# Patient Record
Sex: Female | Born: 1970 | Race: White | Hispanic: No | Smoking: Current every day smoker
Health system: Southern US, Community
[De-identification: ages and names within clinical notes are randomized; demographics above are authoritative.]

## PROBLEM LIST (undated history)

## (undated) DIAGNOSIS — Z789 Other specified health status: Secondary | ICD-10-CM

## (undated) DIAGNOSIS — M549 Dorsalgia, unspecified: Secondary | ICD-10-CM

## (undated) HISTORY — PX: NECK SURGERY: SHX720

## (undated) HISTORY — PX: AUGMENTATION MAMMAPLASTY: SUR837

---

## 1998-03-16 ENCOUNTER — Other Ambulatory Visit: Admission: RE | Admit: 1998-03-16 | Discharge: 1998-03-16 | Payer: Self-pay | Admitting: Obstetrics & Gynecology

## 1998-05-20 ENCOUNTER — Emergency Department (HOSPITAL_COMMUNITY): Admission: EM | Admit: 1998-05-20 | Discharge: 1998-05-20 | Payer: Self-pay | Admitting: Emergency Medicine

## 1998-06-21 ENCOUNTER — Other Ambulatory Visit: Admission: RE | Admit: 1998-06-21 | Discharge: 1998-06-21 | Payer: Self-pay | Admitting: Obstetrics & Gynecology

## 1999-03-08 ENCOUNTER — Emergency Department (HOSPITAL_COMMUNITY): Admission: EM | Admit: 1999-03-08 | Discharge: 1999-03-09 | Payer: Self-pay

## 2000-09-08 ENCOUNTER — Emergency Department (HOSPITAL_COMMUNITY): Admission: EM | Admit: 2000-09-08 | Discharge: 2000-09-08 | Payer: Self-pay | Admitting: Emergency Medicine

## 2000-09-08 ENCOUNTER — Encounter: Payer: Self-pay | Admitting: Internal Medicine

## 2001-02-25 ENCOUNTER — Other Ambulatory Visit: Admission: RE | Admit: 2001-02-25 | Discharge: 2001-02-25 | Payer: Self-pay | Admitting: Obstetrics & Gynecology

## 2002-03-06 ENCOUNTER — Other Ambulatory Visit: Admission: RE | Admit: 2002-03-06 | Discharge: 2002-03-06 | Payer: Self-pay | Admitting: Obstetrics & Gynecology

## 2003-04-22 ENCOUNTER — Emergency Department (HOSPITAL_COMMUNITY): Admission: EM | Admit: 2003-04-22 | Discharge: 2003-04-22 | Payer: Self-pay | Admitting: Emergency Medicine

## 2003-04-24 ENCOUNTER — Emergency Department (HOSPITAL_COMMUNITY): Admission: EM | Admit: 2003-04-24 | Discharge: 2003-04-24 | Payer: Self-pay | Admitting: Emergency Medicine

## 2003-04-26 ENCOUNTER — Ambulatory Visit (HOSPITAL_COMMUNITY): Admission: RE | Admit: 2003-04-26 | Discharge: 2003-04-26 | Payer: Self-pay | Admitting: *Deleted

## 2003-08-16 ENCOUNTER — Other Ambulatory Visit: Admission: RE | Admit: 2003-08-16 | Discharge: 2003-08-16 | Payer: Self-pay | Admitting: Obstetrics & Gynecology

## 2006-03-23 ENCOUNTER — Emergency Department (HOSPITAL_COMMUNITY): Admission: EM | Admit: 2006-03-23 | Discharge: 2006-03-23 | Payer: Self-pay | Admitting: Emergency Medicine

## 2006-03-24 ENCOUNTER — Ambulatory Visit (HOSPITAL_COMMUNITY): Admission: RE | Admit: 2006-03-24 | Discharge: 2006-03-24 | Payer: Self-pay | Admitting: Emergency Medicine

## 2006-04-19 ENCOUNTER — Inpatient Hospital Stay (HOSPITAL_COMMUNITY): Admission: RE | Admit: 2006-04-19 | Discharge: 2006-04-21 | Payer: Self-pay | Admitting: Neurosurgery

## 2006-04-19 ENCOUNTER — Encounter (INDEPENDENT_AMBULATORY_CARE_PROVIDER_SITE_OTHER): Payer: Self-pay | Admitting: Specialist

## 2006-05-05 ENCOUNTER — Emergency Department (HOSPITAL_COMMUNITY): Admission: EM | Admit: 2006-05-05 | Discharge: 2006-05-05 | Payer: Self-pay | Admitting: Emergency Medicine

## 2006-05-07 ENCOUNTER — Ambulatory Visit (HOSPITAL_COMMUNITY): Admission: RE | Admit: 2006-05-07 | Discharge: 2006-05-07 | Payer: Self-pay | Admitting: Neurosurgery

## 2007-06-20 IMAGING — CT CT CERVICAL SPINE W/O CM
3 of 7 series · 10 of 33 positions shown, 12 images · IV contrast (agent unspecified)
Comparison: 04/22/2003
COMPARISON: MRI of the cervical spine 03/24/2006 and intraoperative imaging of the cervical spine 04/19/2006.

CLINICAL DATA: Syncope resulting in fall.  Neck pain and history of recent anterior cervical fusion spanning from C3 to C6 on 04/19/2006.
 HEAD CT WITHOUT CONTRAST:
TECHNIQUE: Contiguous axial images were obtained from the base of the skull through the vertex according to standard protocol without contrast.
TECHNIQUE: Multidetector CT imaging of the cervical spine was performed.  Multiplanar CT image reconstructions were also generated.

[Series 600: cor · coronal · 0.35mm/px · 3 of 37 slices shown]
[im 8/37  bone]
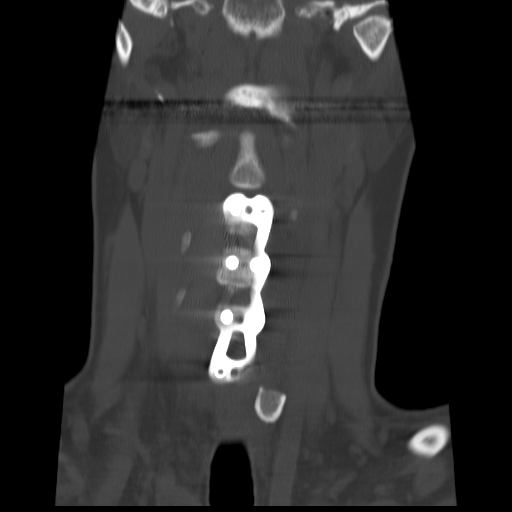
[im 15/37  bone]
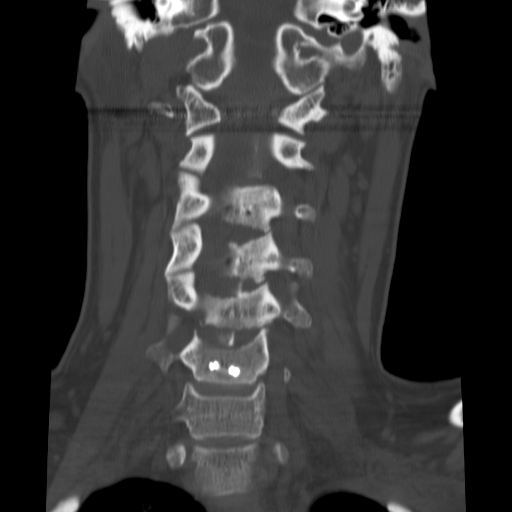
[im 22/37  bone]
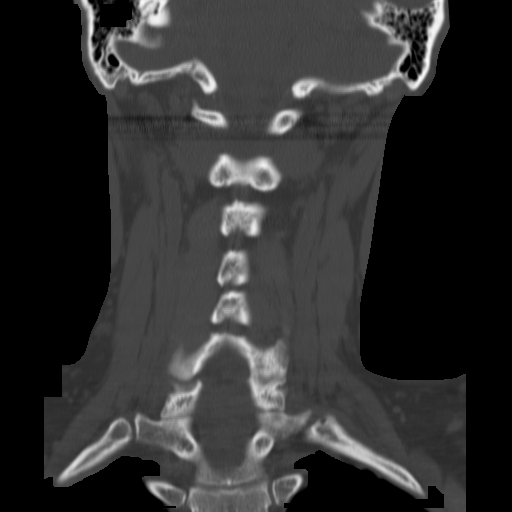

[Series 601: axials · axial · 0.35mm/px · z∈[-53,-1]mm · 2 of 83 slices shown, 3 images]
[im 28/83  soft-tissue]
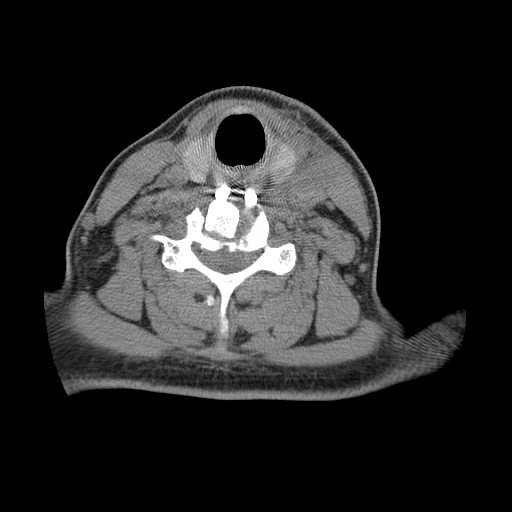
[im 28/83  bone]
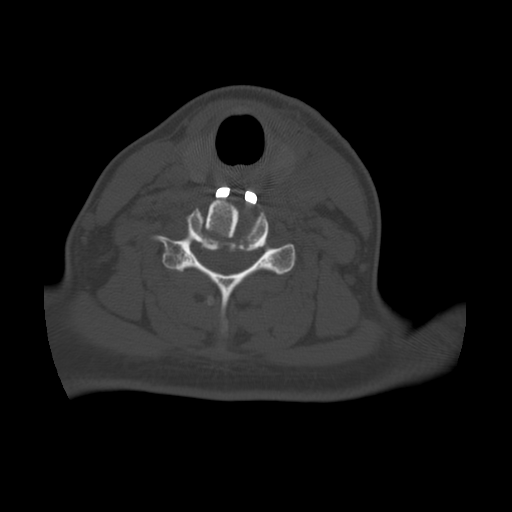
[im 55/83  bone]
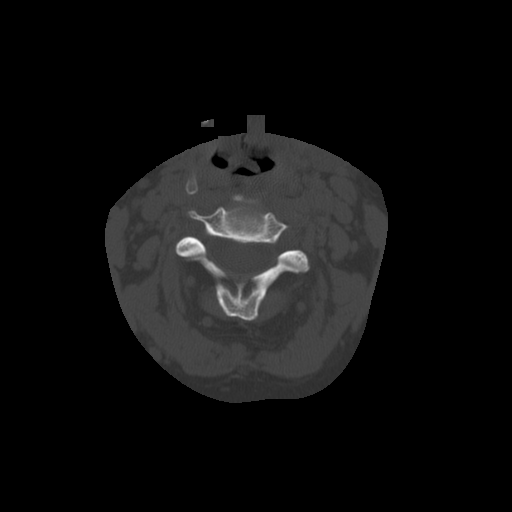

[Series 602: sag · sagittal · 0.35mm/px · 5 of 33 slices shown, 6 images]
[im 11/33  bone]
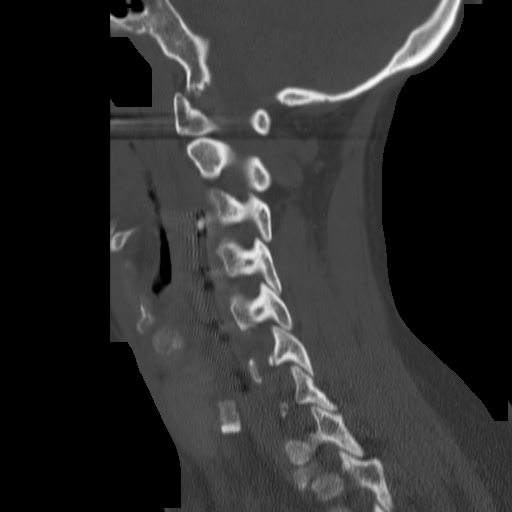
[im 14/33  bone]
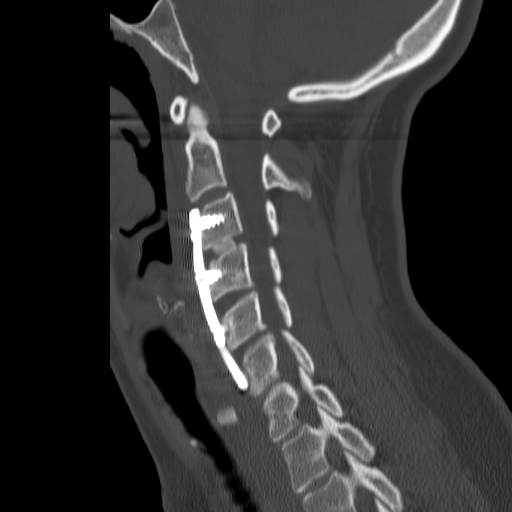
[im 17/33  soft-tissue]
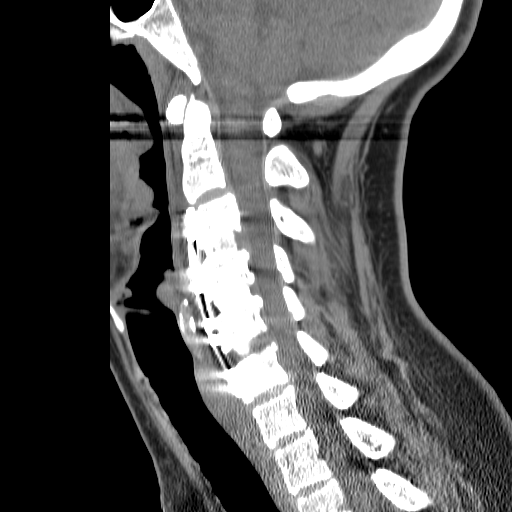
[im 17/33  bone]
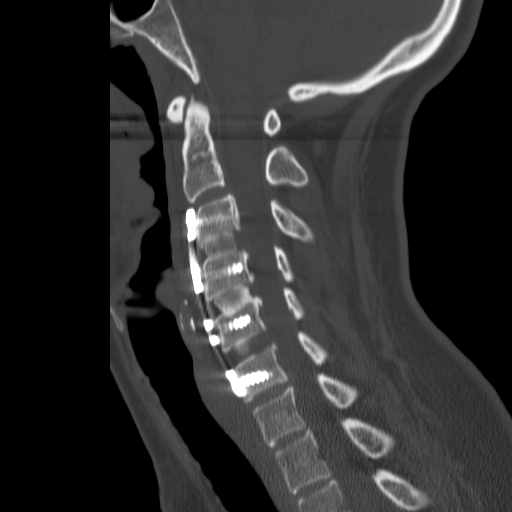
[im 19/33  bone]
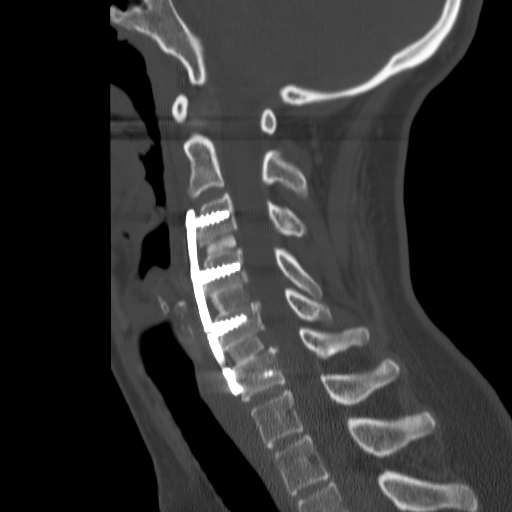
[im 22/33  bone]
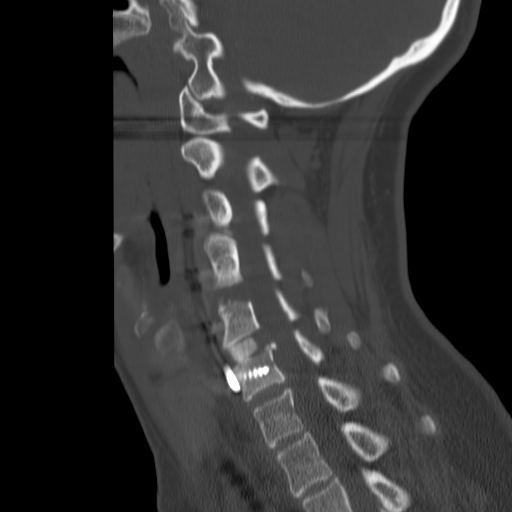

[10 of 33 positions shown; findings below may reference images not displayed]

FINDINGS: There is no evidence of intracranial hemorrhage, brain edema, acute infarct, mass lesion, or mass effect.  No other intra-axial abnormalities 
 are seen, and the ventricles are within normal limits.  No abnormal 
 extra-axial fluid collections or masses are identified.  No skull 
 abnormalities are noted.
IMPRESSION: Negative non-contrast head CT.
 CERVICAL SPINE CT WITHOUT CONTRAST:
FINDINGS: Cervical CT demonstrates anatomic alignment status post cervical fusion.  Spanning from the C3 to C6 levels.  No acute fracture or abnormal positioning of fusion hardware is noted.  Bone allograft material is present at the disk spaces at C3-4, C4-5 and C5-6.   The disk space bone material appears well positioned.  There is an additional piece of corticated bone present in the anterior soft tissue to the left of midline lying left of the esophagus and immediately posterior to the midportion of the thyroid gland.  This piece of bone measures 6 x 10 mm.  The bone fragment lies roughly opposite the C6-7 disk space level relative to the spine.   
 The piece of bone in the soft tissues is not associated with abnormal fluid collection or mass effect on adjacent structures.  No bony extrusion or hardware extrusion is seen into the spinal canal itself.  The airway is normally patent.
IMPRESSION: Normal alignment of fused vertebral bodies status post recent anterior cervical fusion spanning from C3 to C6.  There is a single piece of bone graft material present in the anterior paraspinous tissues at the level of the thyroid gland.  This lies just anterior to the C6-7 level and to the left of midline with the focal corticated fragment measuring 6 x 10 mm.  No surrounding inflammation or fluid collection.

## 2008-03-18 ENCOUNTER — Inpatient Hospital Stay (HOSPITAL_COMMUNITY): Admission: AD | Admit: 2008-03-18 | Discharge: 2008-03-18 | Payer: Self-pay | Admitting: Obstetrics and Gynecology

## 2009-06-24 ENCOUNTER — Encounter: Admission: RE | Admit: 2009-06-24 | Discharge: 2009-06-24 | Payer: Self-pay | Admitting: Obstetrics & Gynecology

## 2010-04-27 LAB — CBC
Hemoglobin: 13.6 g/dL (ref 12.0–15.0)
MCHC: 33.6 g/dL (ref 30.0–36.0)
Platelets: 228 10*3/uL (ref 150–400)
RBC: 4.24 MIL/uL (ref 3.87–5.11)

## 2010-04-27 LAB — COMPREHENSIVE METABOLIC PANEL
ALT: 20 U/L (ref 0–35)
AST: 17 U/L (ref 0–37)
Alkaline Phosphatase: 40 U/L (ref 39–117)
CO2: 28 mEq/L (ref 19–32)
Creatinine, Ser: 0.65 mg/dL (ref 0.4–1.2)
Glucose, Bld: 90 mg/dL (ref 70–99)
Potassium: 3.9 mEq/L (ref 3.5–5.1)
Total Bilirubin: 0.5 mg/dL (ref 0.3–1.2)
Total Protein: 7 g/dL (ref 6.0–8.3)

## 2010-04-27 LAB — TYPE AND SCREEN: Antibody Screen: NEGATIVE

## 2010-04-27 LAB — HCG, QUANTITATIVE, PREGNANCY: hCG, Beta Chain, Quant, S: <2 m[IU]/mL (ref <5)

## 2010-06-02 NOTE — Op Note (Signed)
Eileen Cisneros, Eileen Cisneros             ACCOUNT NO.:  [id number]   MEDICAL RECORD NO.:  [id number]          PATIENT TYPE:  AMB   LOCATION:  SDS                          FACILITY:  [facility]   PHYSICIAN:  Hilda Lias, M.D.   DATE OF BIRTH:  March 26, 1970   DATE OF PROCEDURE:  05/07/2006  DATE OF DISCHARGE:                               OPERATIVE REPORT   PREOPERATIVE DIAGNOSES:  1. Status post cervical fusion 3-4, 4-5, 5-6.  2. Bone graft in the soft tissue of the left neck beside the trachea.   POSTOPERATIVE DIAGNOSES:  1. Status post cervical fusion 3-4, 4-5, 5-6.  2. Bone graft in the soft tissue of the left neck beside the trachea.   PROCEDURE:  Exploration of the cervical wound.  Removal of small piece  of bone graft.   SURGEON:  Hilda Lias, M.D.   CLINICAL HISTORY:  Eileen Cisneros is a 40 year old female who several  weeks ago, had an episode where she went completely qaudriparetic.  The  patient did improve.  She came to see me in the office and, indeed, we  found she has a severe case of stenosis from C3 onto C5-C6.  Surgery was  advised.  The patient had surgery.  The patient did very well, went home  without any problems.  During the procedure, we did at least 4 x-rays, 1  to localize and 3 to see the position of the bone graft and the plate.  The x-ray with no evidence of any abnormality in the soft tissue.  The  patient went home.  Unfortunately on Sunday, she took some Percocet.  She fainted and she fell backwards and hit her neck and she came to the  emergency room.  In the emergency room, a CT scan of the head was  negative.  The CT scan of the cervical spine was negative with the bone  graft and the plate in good position and there was density light at bone  graft in the soft tissue right beside the trachea.  I saw the x-ray  myself.  I went and reviewed all of the x-rays and, indeed, there was no  evidence of any during the surgery.  Nevertheless, indeed, the bone  behaved as a piece of bone graft.  It might be that during the procedure  when we were shaving the sites of the bone graft, a piece of bone fell  into the soft tissue and was not picked up by x-ray.  I talked to her  about leaving the bone graft or proceeding with surgery and she really  does not want to have any surgery because she was worried about that  being too close to the trachea.   PROCEDURE IN DETAIL:  The patient was taken to the operative room.  Infiltration of the skin was made with Xylocaine.  Intravenous sedation  was done.  The skin was sprayed with DuraPrep.  Drapes were applied and  incision from the previous one was made.  We opened the platysma.  Retraction was made medially and right in the mid sternocleidomastoid  muscle was the piece of  bone graft, which was removed.  Indeed, the bone  graft looked like a piece of bone graft that normally we use for the  fusion.  After that, hemostasis was done with cautery.  We waited at  least 5 minutes just to be sure there was no evidence of any bleeding.  When we were sure that there was no  bleeding, the platysma was closed with 3-0 Vicryl and the skin with a 4-  0 Vicryl.  The patient did well.  She is going to go to PACU and  discharged once she is stable.  She is going to be followed by me in the  office.   Today after surgery, I spoke with her friend who is going to stay with  her overnight.           ______________________________  Hilda Lias, M.D.     EB/MEDQ  D:  05/07/2006  T:  05/07/2006  Job:  [id number]

## 2010-06-02 NOTE — Op Note (Signed)
NAMESHARLYNN, Eileen Cisneros NO.:  [id number]   MEDICAL RECORD NO.:  [id number]          PATIENT TYPE:  INP   LOCATION:  3029                         FACILITY:  [facility]   PHYSICIAN:  Hilda Lias, M.D.   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  Status post quadriparesis.   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  Status post quadriparesis.   PROCEDURE:  Anterior C3-4, C4-5, C5-6 diskectomy, decompression of the  spinal cord, interbody fusion with allograft, plate, microscope.   SURGEON:  Hilda Lias, M.D.   ASSISTANT:  Danae Orleans. Venetia Maxon, M.D.   CLINICAL HISTORY:  [name] is a 40 year old female who was seen in  my office after she had one episode of quadriparesis.  When she came to  see me, she was complaining of a burning sensation in both arms.  X-rays  showed severe stenosis at C5-6, C4-5 and borderline between C3-4.  Because of the findings, surgery was advised.  We waited at least 2  weeks during which the burning sensation was stable.  The surgery was  advised.  The risks were explained fully to her.   PROCEDURE:  The patient was taken to the OR and after intubation, the  neck was prepped with DuraPrep.  A transverse incision was made through  the skin and subcutaneous tissue.  Then we identified that there was  quite a bit of narrow and quite a bit of overgrowth of the muscle.  There was an area which like a very large lymph node. and it was sent to  the laboratory.  He came back as a benign lymph node.  From then on the  x-ray showed that indeed were at the level of C5-6.  The anterior  ligament of C5-6 was opened and so at the level of C4-5 and C3-4.  The  difficult part of the procedure was the dissection around C3-4  because  we were right at the bifurcation of the carotid artery.  Nevertheless,  because C5-6 was the worst, we started at this level.  The disk was  removed.  With the microscope we opened the posterior ligament and  degenerative disk was removed right underneath.  Decompression of the  spinal cord as well C6 nerve root was achieved.  The same finding was at  the level of C4-5 with good decompression at the end.  At the level of  C3-4, we found mostly large osteophyte on midline to the left.  After we  had good decompression, because she had __________ lordosis, we went  ahead and put a roll blanket underneath the shoulder.  Then I was able  to tailor three bone grafts ranging from 7-9 mm lordotics.  There were  inserted.  The larger one was inserted into C5-6, then C4-5 and C3-4.  One x-ray showed good position of the graft.  Then we used  a plate and 8  screws were used to keep the bone graft in place.  Lateral  cervical spine showed good position of the graft and there was some  correction of the lordosis.  From then on the area was irrigated.  Although we achieved good hemostasis, a drain was left.  The wound was  closed with Vicryl and Steri-Strip.           ______________________________  Hilda Lias, M.D.     EB/MEDQ  D:  04/19/2006  T:  04/19/2006  Job:  [id number]

## 2010-06-02 NOTE — Consult Note (Signed)
NAMEPEGGY, LOGE NO.:  [id number]   MEDICAL RECORD NO.:  [id number]          PATIENT TYPE:  EMS   LOCATION:  MAJO                         FACILITY:  [facility]   PHYSICIAN:  Stefani Dama, M.D.  DATE OF BIRTH:  Nov 12, 1970   DATE OF CONSULTATION:  05/05/2006  DATE OF DISCHARGE:  05/05/2006                                 CONSULTATION   REASON FOR REFERRAL:  Neck pain, syncope, status post anterior cervical  diskectomy 2 weeks ago.   HISTORY OF PRESENT ILLNESS:  Eileen Cisneros is a 40 year old individual  who underwent anterior cervical decompression arthrodesis at three  levels in her neck approximately 3 weeks ago.  She was discharged home  and today was noted to have developed syncope after taking some pain  medication.  She indicates that was first thing in the morning.  She was  taking her pain medication which was Endocet 10/325 one tablet  regularly, and she took some on an empty stomach.  She felt dizzy and  woozy and apparently had a syncopal episode in the house, and was  subsequently found by her 76 year old son.  Because of concerns of  feeling sick and ill on her stomach, she was brought to the emergency  department.  She was seen by Dr. Lynelle Doctor here in the emergency department  and requested evaluation by her neurosurgeon.  I was subsequently  called.  Because she had neck pain, Dr. Lynelle Doctor had ordered a CT scan of  the cervical spine and this was reviewed.  The scan demonstrates that  bone grafts are in good position at C4-C5, C5-C6, C6-C7.  There is an  extra discal piece of bone which may be a piece of bone graft below the  level of C7.  This does not appear to be showing any signs of mass  effect or compression of surrounding structures.  Plate appears to be  well attached to the ventral aspect the vertebral bodies.  Her exam  demonstrates that she has good motor function.  Blood pressure resting  in bed is 98/62, heart rate 96.  When she sits up  her blood pressure is  132/74, heart rate is 102.  She does not feel dizzy.  Her neck reveals  incision to be healing nicely.  Steri-Strips are removed from left side  of the neck.  Back of the neck is nontender to palpation.  No knots are  noted about the head.  Cranial nerve examination reveals that her pupils  are 4 mm, briskly reactive to light and accommodation.  Extraocular  movements are full.  Face is symmetric.  Grimace, tongue and uvula  midline.  Sclerae and conjunctivae are clear.  At this point, I  suggested that the patient may be having a reaction to pain medication.  I advised that she wean herself away from the oxycodone, and I suggested  that she decrease the dose of the oxycodone to half a tablet once or  twice a day and do this for 1-2 days and then discontinue the medication  completely beyond this.  I indicated that Dr. Jeral Fruit would  be in contact  with her to discuss the results of the CT scan of her neck.  I did not  feel that she was in any imminent danger as regards to cervical spine.  However, Dr. Cassandria Santee assessment would be important in deciding whether  anything further needs to be done.  At the current time, she notes that  she is swallowing well.  She is able to tolerate liquids and food well.  She is not feeling dizzy at all.      Stefani Dama, M.D.  Electronically Signed     HJE/MEDQ  D:  05/05/2006  T:  05/06/2006  Job:  [id number]

## 2011-03-07 ENCOUNTER — Other Ambulatory Visit: Payer: Self-pay | Admitting: Obstetrics & Gynecology

## 2011-07-13 ENCOUNTER — Encounter (HOSPITAL_COMMUNITY): Payer: Self-pay | Admitting: Family Medicine

## 2011-07-13 ENCOUNTER — Emergency Department (HOSPITAL_COMMUNITY)
Admission: EM | Admit: 2011-07-13 | Discharge: 2011-07-13 | Disposition: A | Discharge status: Home / Self Care | Payer: BC Managed Care – PPO | Attending: Emergency Medicine | Admitting: Emergency Medicine

## 2011-07-13 DIAGNOSIS — F17200 Nicotine dependence, unspecified, uncomplicated: Secondary | ICD-10-CM | POA: Insufficient documentation

## 2011-07-13 DIAGNOSIS — R110 Nausea: Secondary | ICD-10-CM

## 2011-07-13 HISTORY — DX: Dorsalgia, unspecified: M54.9

## 2011-07-13 LAB — CBC WITH DIFFERENTIAL/PLATELET
Basophils Relative: 0 % (ref 0–1)
Eosinophils Absolute: 0 10*3/uL (ref 0.0–0.7)
HCT: 43.1 % (ref 36.0–46.0)
Hemoglobin: 14.8 g/dL (ref 12.0–15.0)
MCH: 31.8 pg (ref 26.0–34.0)
MCHC: 34.3 g/dL (ref 30.0–36.0)
Monocytes Absolute: 0.5 10*3/uL (ref 0.1–1.0)
Monocytes Relative: 4 % (ref 3–12)
Platelets: 213 10*3/uL (ref 150–400)
RBC: 4.65 MIL/uL (ref 3.87–5.11)

## 2011-07-13 LAB — URINALYSIS, ROUTINE W REFLEX MICROSCOPIC
Bilirubin Urine: NEGATIVE
Protein, ur: NEGATIVE mg/dL
Specific Gravity, Urine: 1.016 (ref 1.005–1.030)

## 2011-07-13 LAB — COMPREHENSIVE METABOLIC PANEL
ALT: 14 U/L (ref 0–35)
Albumin: 3.7 g/dL (ref 3.5–5.2)
CO2: 23 mEq/L (ref 19–32)
Creatinine, Ser: 0.63 mg/dL (ref 0.50–1.10)
GFR calc Af Amer: >90 mL/min (ref >90)
Total Bilirubin: 0.4 mg/dL (ref 0.3–1.2)

## 2011-07-13 LAB — LIPASE, BLOOD: Lipase: 38 U/L (ref 11–59)

## 2011-07-13 MED ORDER — ONDANSETRON 8 MG PO TBDP
8.0000 mg | ORAL_TABLET | Freq: Once | ORAL | Status: AC
  Administered 2011-07-13: 8 mg via ORAL

## 2011-07-13 MED ORDER — ONDANSETRON HCL 4 MG PO TABS: 4.0000 mg | ORAL_TABLET | Freq: Four times a day (QID) | ORAL | Status: AC

## 2011-07-13 MED ORDER — ONDANSETRON 8 MG PO TBDP
ORAL_TABLET | ORAL | Status: AC
  Filled 2011-07-13: qty 1

## 2011-07-13 NOTE — ED Notes (Signed)
NT to get labs

## 2011-07-13 NOTE — ED Notes (Signed)
[id number]<JY> Expected date:<BR> Expected time:<BR> Means of arrival:<BR> Comments:<BR> Hold

## 2011-07-13 NOTE — ED Notes (Signed)
Pt reports nausea x1 week. Denies vomiting or diarrhea. Denies pain.

## 2011-07-13 NOTE — ED Provider Notes (Signed)
Medical screening examination/treatment/procedure(s) were performed by non-physician practitioner and as supervising physician I was immediately available for consultation/collaboration.  Devoria Albe, MD, FACEP   Ward Givens, MD 07/13/11 [phone]

## 2011-07-13 NOTE — ED Provider Notes (Signed)
History     CSN: [id number]  Arrival date & time 07/13/11  1043   First MD Initiated Contact with Patient 07/13/11 1056      Chief Complaint  Patient presents with  . Nausea    (Consider location/radiation/quality/duration/timing/severity/associated sxs/prior treatment) HPI  41 year old female presents complain of nausea. Patient reports for the past week she has had persistence nausea. Her nausea worse with eating. She has no appetite. Aside from nausea, patient denies vomiting, having diarrhea or constipation. She denies fever but does endorse chills. She denies chest pain, shortness of breath, abdominal pain, back pain, urinary symptoms, or rash. She denies increased level of stress. She does endorse nausea even without eating. She is able to tolerates fluid. Patient denies prior abdominal surgery, or having her gallbladder removed. She denies similar symptoms in the past. She has not tried anything to alleviate her symptom.    Patient also reports that she has a Mirena IUD. She worries that this may be a cause of her symptom and would like checked for placement. However, she denies any abdominal pain, vaginal bleeding, or vaginal discharge.  Past Medical History  Diagnosis Date  . Back pain     Past Surgical History  Procedure Date  . Neck surgery     History reviewed. No pertinent family history.  History  Substance Use Topics  . Smoking status: Current Everyday Smoker -- 0.5 packs/day  . Smokeless tobacco: Not on file  . Alcohol Use: No    OB History    Grav Para Term Preterm Abortions TAB SAB Ect Mult Living                  Review of Systems  All other systems reviewed and are negative.    Allergies  Septra  Home Medications  No current outpatient prescriptions on file.  BP 108/83  Pulse 91  Temp 97.8 F (36.6 C) (Oral)  Resp 16  SpO2 98%  Physical Exam  Nursing note and vitals reviewed. Constitutional: She appears well-developed and  well-nourished. No distress.       Awake, alert, nontoxic appearance  HENT:  Head: Atraumatic.  Eyes: Conjunctivae are normal. Right eye exhibits no discharge. Left eye exhibits no discharge.  Neck: Neck supple.  Cardiovascular: Normal rate and regular rhythm.   Pulmonary/Chest: Effort normal. No respiratory distress. She exhibits no tenderness.       Abdomen nontender on exam. No Murphy sign, negative McBurney point. No CVA tenderness  Abdominal: Soft. There is no tenderness. There is no rebound.  Genitourinary: Vagina normal. No labial fusion. There is no rash or tenderness on the right labia. There is no rash or tenderness on the left labia. Uterus is not tender. Cervix exhibits no motion tenderness, no discharge and no friability. Right adnexum displays no mass and no tenderness. Left adnexum displays no mass and no tenderness. No tenderness or bleeding around the vagina. No vaginal discharge found.       Chaperone present.    Mirena pull-string appears in place.  No obvious changes to indicate incorrect placement based on visual exam.    Musculoskeletal: She exhibits no tenderness.       ROM appears intact, no obvious focal weakness  Neurological:       Mental status and motor strength appears intact  Skin: No rash noted.  Psychiatric: She has a normal mood and affect.    ED Course  Procedures (including critical care time)  Labs Reviewed - No data to  display No results found.   No diagnosis found.   Date: 07/13/2011  Rate: 78  Rhythm: normal sinus rhythm  QRS Axis: normal  Intervals: normal  ST/T Wave abnormalities: normal  Conduction Disutrbances: none  Narrative Interpretation:   Old EKG Reviewed: no prior ECG for comparison   Results for orders placed during the hospital encounter of 07/13/11  CBC WITH DIFFERENTIAL      Component Value Range   WBC 10.3  4.0 - 10.5 K/uL   RBC 4.65  3.87 - 5.11 MIL/uL   Hemoglobin 14.8  12.0 - 15.0 g/dL   HCT 16.1  09.6 - 04.5 %    MCV 92.7  78.0 - 100.0 fL   MCH 31.8  26.0 - 34.0 pg   MCHC 34.3  30.0 - 36.0 g/dL   RDW 40.9  81.1 - 91.4 %   Platelets 213  150 - 400 K/uL   Neutrophils Relative 76  43 - 77 %   Neutro Abs 7.8 (*) 1.7 - 7.7 K/uL   Lymphocytes Relative 20  12 - 46 %   Lymphs Abs 2.1  0.7 - 4.0 K/uL   Monocytes Relative 4  3 - 12 %   Monocytes Absolute 0.5  0.1 - 1.0 K/uL   Eosinophils Relative 0  0 - 5 %   Eosinophils Absolute 0.0  0.0 - 0.7 K/uL   Basophils Relative 0  0 - 1 %   Basophils Absolute 0.0  0.0 - 0.1 K/uL  COMPREHENSIVE METABOLIC PANEL      Component Value Range   Sodium 137  135 - 145 mEq/L   Potassium 3.8  3.5 - 5.1 mEq/L   Chloride 104  96 - 112 mEq/L   CO2 23  19 - 32 mEq/L   Glucose, Bld 102 (*) 70 - 99 mg/dL   BUN 8  6 - 23 mg/dL   Creatinine, Ser 7.82  0.50 - 1.10 mg/dL   Calcium 9.3  8.4 - 95.6 mg/dL   Total Protein 6.8  6.0 - 8.3 g/dL   Albumin 3.7  3.5 - 5.2 g/dL   AST 14  0 - 37 U/L   ALT 14  0 - 35 U/L   Alkaline Phosphatase 44  39 - 117 U/L   Total Bilirubin 0.4  0.3 - 1.2 mg/dL   GFR calc non Af Amer >90  >90 mL/min   GFR calc Af Amer >90  >90 mL/min  LIPASE, BLOOD      Component Value Range   Lipase 38  11 - 59 U/L  URINALYSIS, ROUTINE W REFLEX MICROSCOPIC      Component Value Range   Color, Urine YELLOW  YELLOW   APPearance CLEAR  CLEAR   Specific Gravity, Urine 1.016  1.005 - 1.030   pH 6.0  5.0 - 8.0   Glucose, UA NEGATIVE  NEGATIVE mg/dL   Hgb urine dipstick NEGATIVE  NEGATIVE   Bilirubin Urine NEGATIVE  NEGATIVE   Ketones, ur NEGATIVE  NEGATIVE mg/dL   Protein, ur NEGATIVE  NEGATIVE mg/dL   Urobilinogen, UA 0.2  0.0 - 1.0 mg/dL   Nitrite NEGATIVE  NEGATIVE   Leukocytes, UA NEGATIVE  NEGATIVE  PREGNANCY, URINE      Component Value Range   Preg Test, Ur NEGATIVE  NEGATIVE   No results found.    MDM  Persistent nausea. Work up initiated.  Pelvic exam requested by patient to check for IUD placement were performed and unremarkable. Zofran  given.  VSS  1:00 PM No significant finding on exam and with labs.  Reassurance given.  Will prescribe zofran as needed and f/u with OBGYN for further care.  Strict return precaution discussed.  Pt agrees.          Fayrene Helper, PA-C 07/13/11 1301

## 2011-07-13 NOTE — Discharge Instructions (Signed)
Nausea, Adult Nausea is the feeling that you have an upset stomach or have to vomit. Nausea by itself is not likely a serious concern, but it may be an early sign of more serious medical problems. As nausea gets worse, it can lead to vomiting. If vomiting develops, there is the risk of dehydration.  CAUSES   Viral infections.   Food poisoning.   Medicines.   Pregnancy.   Motion sickness.   Migraine headaches.   Emotional distress.   Severe pain from any source.   Alcohol intoxication.  HOME CARE INSTRUCTIONS  Get plenty of rest.   Ask your caregiver about specific rehydration instructions.   Eat small amounts of food and sip liquids more often.   Take all medicines as told by your caregiver.  SEEK MEDICAL CARE IF:  You have not improved after 2 days, or you get worse.   You have a headache.  SEEK IMMEDIATE MEDICAL CARE IF:   You have a fever.   You faint.   You keep vomiting or have blood in your vomit.   You are extremely weak or dehydrated.   You have dark or bloody stools.   You have severe chest or abdominal pain.  MAKE SURE YOU:  Understand these instructions.   Will watch your condition.   Will get help right away if you are not doing well or get worse.  Document Released: 02/09/2004 Document Revised: 12/21/2010 Document Reviewed: 09/13/2010 [facility] Patient Information 2012 [place], [place].  RESOURCE GUIDE  Chronic Pain Problems: Contact [facility]  [phone] Patients need to be referred by their primary care doctor.  Insufficient Money for Medicine: Contact United Way:  call "211" or [facility] [phone].  No Primary Care Doctor: - Call Health Connect  [phone] - can help you locate a primary care doctor that  accepts your insurance, provides certain services, etc. - Physician Referral [phone]  Agencies that provide inexpensive medical care: - [facility]  [phone] - [facility]  [phone] - [facility]  [phone] [facility]  [phone] - [facility]  [phone]  [phone]  Medicaid-accepting [place] [facility] Providers: - [facility]- [address]  [phone], Mon-Fri 9am-7pm, Sat 9am-1pm - [facility]- [address] [place], Suite [place]  [phone] - [facility]- [address], [address] [place]  [phone] [facility]- [address]  [phone] - Renaye Rakers- [address] [place], [address], [phone]  Only accepts [place] patients after they have their name  applied to their card  Self Pay (no insurance) in [place]: - Sickle Cell Patients: Dr Willey Blade, [facility]  [address] [place], [phone] - [facility]- [address] [place]  [phone]       [facility]- [address]       -     [facility]- see information above (Speak to [employer] if you do not have insurance)       -  [facility]- [address] [place], [phone]       -  [facility]- [address],  [phone]       -  [facility]- [address], [phone]       -  Dr Julio Sicks-  [address], [place], [phone]       -  [facility]- [address] [phone]       -  [facility]- [address], [phone], also [address] [phone]       -    [facility]- [address] [place], [phone], 1st & 3rd Saturday   every month, 10am-1pm  1) Find a Doctor and Pay Out of Pocket Although you won't have to find out who is covered by your insurance plan, it is a good idea to ask around and get recommendations. You will then need to call the office and see if the doctor you have chosen will accept you as a new patient and what types of options they offer for  patients who are self-pay. Some doctors offer discounts or will set up payment plans for their patients who do not have insurance, but you will need to ask so you aren't surprised when you get to your appointment.  2) Contact Your Local Health Department Not all health departments have doctors that can see patients for sick visits, but many do, so it is worth a call to see if yours does. If you don't know where your local health department is, you can check in your phone book. The CDC also has a tool to help you locate your state's health department, and many state websites also have listings of all of their local health departments.  3) Find a Walk-in Clinic If your illness is not likely to be very severe or complicated, you may want to try a walk in clinic. These are popping up all over the country in pharmacies, drugstores, and shopping centers. They're usually staffed by nurse practitioners or physician assistants that have been trained to treat common illnesses and complaints. They're usually fairly quick and inexpensive. However, if you have serious medical issues or chronic medical problems, these are probably not your best option  STD Testing - [facility] of [facility], STD Clinic, [address] [place], phone [phone] or [phone].  Monday - Friday, call for an appointment. [facility] of [employer], STD Clinic, [address], [place], phone [phone] or [phone].  Monday - Friday, call for an appointment.  Abuse/Neglect: [facility] Child Abuse Hotline 667-532-5248 [facility] Child Abuse Hotline (347)875-8693 (After Hours)  Emergency Shelter:  [facility] (902)432-0698  Maternity Homes: - Room at the [facility] 9188848147 - [facility] [phone]  MRSA Hotline #:   (336) 403-2640  [facility]  [facility]   United Way [facility] Dept. [address]                 [address]         [address] [place] [address]  [place]                                               Cristobal Goldmann Phone:  [phone]  Phone:  [phone]                   Phone:  [phone]  [facility], [phone] - [phone]       -     [facility] in [place], [address],                                  [phone], [facility] Child Abuse Hotline 305-062-7423 or 515-699-4481 (After Hours)   Behavioral Health Services  Substance Abuse Resources: - [facility] Drug Services  979-491-6740 - [facility] (613)845-8725 - [facility] [phone] Floydene Flock 9095509928 - Residential & Outpatient Substance Abuse Program  [phone]  Psychological Services: [facility]  [phone] [facility] Services  [phone] - [facility], [phone] [place]. [address] [place], ACCESS LINE: (367) [phone] or 9155140844, [employer]za  Dental Assistance  If unable to pay or uninsured, contact:  [facility] or [facility]. to become qualified for the adult dental clinic.  Patients with Medicaid: [facility] [phone] [address], [phone] [address], [phone]  If unable to pay, or uninsured, contact [facility] [phone]) or [facility] Department [phone] in [place], [phone] in [facility]) to become qualified for the adult dental clinic  Other Low-Cost Community Dental Services: - [facility]- [address] [place] [address] [place], [place], [place], [address], [phone], Ext. 123, 2nd and 4th Thursday of the month at 6:30am.  10 clients each day by appointment, can sometimes see walk-in patients if  someone does not show for an appointment. [facility]- [address] [place], [place], [address], [phone] - [facility]- [address], [place], [place], [address], [phone] - [facility]- [phone] [facility]- [phone] [facility] [phone] -

## 2011-11-28 ENCOUNTER — Encounter: Payer: BC Managed Care – PPO | Admitting: Vascular Surgery

## 2011-11-28 ENCOUNTER — Other Ambulatory Visit: Payer: BC Managed Care – PPO

## 2011-11-30 ENCOUNTER — Ambulatory Visit: Payer: BC Managed Care – PPO | Admitting: Rehabilitative and Restorative Service Providers"

## 2011-12-10 ENCOUNTER — Encounter (HOSPITAL_COMMUNITY): Payer: Self-pay | Admitting: Pharmacist

## 2011-12-10 ENCOUNTER — Other Ambulatory Visit: Payer: Self-pay | Admitting: Obstetrics and Gynecology

## 2011-12-12 ENCOUNTER — Other Ambulatory Visit: Payer: Self-pay | Admitting: Obstetrics & Gynecology

## 2011-12-12 DIAGNOSIS — Z1231 Encounter for screening mammogram for malignant neoplasm of breast: Secondary | ICD-10-CM

## 2011-12-18 ENCOUNTER — Encounter (HOSPITAL_COMMUNITY): Payer: Self-pay

## 2011-12-18 ENCOUNTER — Encounter (HOSPITAL_COMMUNITY)
Admission: RE | Admit: 2011-12-18 | Discharge: 2011-12-18 | Disposition: A | Discharge status: Home / Self Care | Payer: BC Managed Care – PPO | Source: Ambulatory Visit | Attending: Obstetrics and Gynecology | Admitting: Obstetrics and Gynecology

## 2011-12-18 HISTORY — DX: Other specified health status: Z78.9

## 2011-12-18 LAB — CBC
HCT: 42.9 % (ref 36.0–46.0)
Hemoglobin: 14.4 g/dL (ref 12.0–15.0)
MCV: 93.5 fL (ref 78.0–100.0)
RDW: 12.8 % (ref 11.5–15.5)
WBC: 8.6 10*3/uL (ref 4.0–10.5)

## 2011-12-18 NOTE — Patient Instructions (Addendum)
20 Eileen Cisneros  12/18/2011   Your procedure is scheduled on:  12/21/11  Enter through the Main Entrance of [facility] at 1100 AM.  Pick up the phone at the desk and dial [phone].   Call this number if you have problems the morning of surgery: [phone]   Remember:   Do not eat food:After Midnight.  Do not drink clear liquids: After Midnight.  Take these medicines the morning of surgery with A SIP OF WATER: NA   Do not wear jewelry, make-up or nail polish.  Do not wear lotions, powders, or perfumes. You may wear deodorant.  Do not shave 48 hours prior to surgery.  Do not bring valuables to the hospital.  Contacts, dentures or bridgework may not be worn into surgery.  Leave suitcase in the car. After surgery it may be brought to your room.  For patients admitted to the hospital, checkout time is 11:00 AM the day of discharge.   Patients discharged the day of surgery will not be allowed to drive home.  Name and phone number of your driver: Lulu Riding  Special Instructions: Shower using CHG 2 nights before surgery and the night before surgery.  If you shower the day of surgery use CHG.  Use special wash - you have one bottle of CHG for all showers.  You should use approximately 1/3 of the bottle for each shower.   Please read over the following fact sheets that you were given: MRSA Information

## 2011-12-20 NOTE — H&P (Signed)
Eileen Cisneros is an 41 y.o. female. G3 P2013 requests at a tubal sterilization be performed. She had been using a Mirena  IUD but had this removed in July 2013. She know would like a permanent method of sterilization.   Pertinent Gynecological History: Menses: flow is moderate Contraception: condoms DES exposure: denies Blood transfusions: none Sexually transmitted diseases: no past history Last pap: normal Date: 02/2011   No LMP recorded. Patient is not currently having periods (Reason: IUD).    Past Medical History  Diagnosis Date  . Back pain   . No pertinent past medical history     Past Surgical History  Procedure Date  . Neck surgery     No family history on file.  Social History:  reports that she has been smoking.  She does not have any smokeless tobacco history on file. She reports that she does not drink alcohol or use illicit drugs.  Allergies:  Allergies  Allergen Reactions  . Septra (Sulfamethoxazole W-Trimethoprim) Hives and Other (See Comments)    Childhood reaction Hallucinations    No prescriptions prior to admission    ROS  Respiratory: no shortness of breath or cough Cardiac: no chest pain or irregular heart beats GI: no nausea vomting constipation or diarrhea Gyn: Cycles are regular. She denies increased pain or flow.  Height 5\' 5"  (1.651 m), weight 145 lb (65.772 kg). Physical Exam  Head: Normocephalic and atraumatic Eyes: PERRLA Neck Supple no JVD no increased thyroid Abdomen: soft without enlargement of the liver kidneys or spleen Pelvic Exam;    External Genitalia: within normal limits   BUS: within normal limits   Vagin:  without lesion. Normal discharge   Cervix: non tender and without lesions   Uterus: normal size and shape and non tender   Adnexa: non tender. No masses are found   No results found for this or any previous visit (from the past 24 hour(s)).  No results found.  Impression: Desired Sterilization  Plan:  Laparoscopic tubal sterilization.  The risks and benefits and failure rate have been discussed and informed consent has been obtained. Eileen Cisneros 12/20/2011, 7:12 PM

## 2011-12-21 ENCOUNTER — Encounter (HOSPITAL_COMMUNITY): Payer: Self-pay

## 2011-12-21 ENCOUNTER — Encounter (HOSPITAL_COMMUNITY): Payer: Self-pay | Admitting: Anesthesiology

## 2011-12-21 ENCOUNTER — Ambulatory Visit (HOSPITAL_COMMUNITY): Payer: BC Managed Care – PPO | Admitting: Anesthesiology

## 2011-12-21 ENCOUNTER — Encounter (HOSPITAL_COMMUNITY)
Admission: RE | Disposition: A | Discharge status: Home / Self Care | Payer: Self-pay | Source: Ambulatory Visit | Attending: Obstetrics and Gynecology

## 2011-12-21 ENCOUNTER — Ambulatory Visit (HOSPITAL_COMMUNITY)
Admission: RE | Admit: 2011-12-21 | Discharge: 2011-12-21 | Disposition: A | Discharge status: Home / Self Care | Payer: BC Managed Care – PPO | Source: Ambulatory Visit | Attending: Obstetrics and Gynecology | Admitting: Obstetrics and Gynecology

## 2011-12-21 DIAGNOSIS — Z01812 Encounter for preprocedural laboratory examination: Secondary | ICD-10-CM | POA: Insufficient documentation

## 2011-12-21 DIAGNOSIS — Z01818 Encounter for other preprocedural examination: Secondary | ICD-10-CM | POA: Insufficient documentation

## 2011-12-21 DIAGNOSIS — Z302 Encounter for sterilization: Secondary | ICD-10-CM | POA: Insufficient documentation

## 2011-12-21 HISTORY — PX: LAPAROSCOPIC TUBAL LIGATION: SHX1937

## 2011-12-21 LAB — PROTIME-INR: INR: 0.95 (ref 0.00–1.49)

## 2011-12-21 LAB — BASIC METABOLIC PANEL
BUN: 10 mg/dL (ref 6–23)
CO2: 25 mEq/L (ref 19–32)
GFR calc non Af Amer: >90 mL/min (ref >90)
Glucose, Bld: 88 mg/dL (ref 70–99)
Potassium: 3.8 mEq/L (ref 3.5–5.1)

## 2011-12-21 SURGERY — LIGATION, FALLOPIAN TUBE, LAPAROSCOPIC
Anesthesia: General | Site: Abdomen | Laterality: Bilateral | Wound class: Clean Contaminated

## 2011-12-21 MED ORDER — ROCURONIUM BROMIDE 50 MG/5ML IV SOLN
INTRAVENOUS | Status: AC
  Filled 2011-12-21: qty 1

## 2011-12-21 MED ORDER — MIDAZOLAM HCL 5 MG/5ML IJ SOLN: INTRAMUSCULAR | Status: DC | PRN

## 2011-12-21 MED ORDER — LIDOCAINE HCL (CARDIAC) 20 MG/ML IV SOLN
INTRAVENOUS | Status: AC
  Filled 2011-12-21: qty 5

## 2011-12-21 MED ORDER — FENTANYL CITRATE 0.05 MG/ML IJ SOLN
INTRAMUSCULAR | Status: AC
  Filled 2011-12-21: qty 5

## 2011-12-21 MED ORDER — FENTANYL CITRATE 0.05 MG/ML IJ SOLN
25.0000 ug | INTRAMUSCULAR | Status: DC | PRN
  Administered 2011-12-21: 50 ug via INTRAVENOUS

## 2011-12-21 MED ORDER — MIDAZOLAM HCL 5 MG/5ML IJ SOLN
INTRAMUSCULAR | Status: DC | PRN
  Administered 2011-12-21: 1 mg via INTRAVENOUS

## 2011-12-21 MED ORDER — FENTANYL CITRATE 0.05 MG/ML IJ SOLN
INTRAMUSCULAR | Status: AC
  Administered 2011-12-21: 50 ug via INTRAVENOUS
  Filled 2011-12-21: qty 2

## 2011-12-21 MED ORDER — ONDANSETRON HCL 4 MG/2ML IJ SOLN
INTRAMUSCULAR | Status: AC
  Filled 2011-12-21: qty 2

## 2011-12-21 MED ORDER — FENTANYL CITRATE 0.05 MG/ML IJ SOLN
INTRAMUSCULAR | Status: DC | PRN
  Administered 2011-12-21: 100 ug via INTRAVENOUS
  Administered 2011-12-21: 150 ug via INTRAVENOUS

## 2011-12-21 MED ORDER — DEXAMETHASONE SODIUM PHOSPHATE 4 MG/ML IJ SOLN
INTRAMUSCULAR | Status: DC | PRN
  Administered 2011-12-21: 10 mg via INTRAVENOUS

## 2011-12-21 MED ORDER — LIDOCAINE HCL (CARDIAC) 20 MG/ML IV SOLN
INTRAVENOUS | Status: DC | PRN
  Administered 2011-12-21: 80 mg via INTRAVENOUS

## 2011-12-21 MED ORDER — KETOROLAC TROMETHAMINE 30 MG/ML IJ SOLN
INTRAMUSCULAR | Status: DC | PRN
  Administered 2011-12-21: 30 mg via INTRAVENOUS

## 2011-12-21 MED ORDER — LACTATED RINGERS IV SOLN
INTRAVENOUS | Status: DC
  Administered 2011-12-21: 13:00:00 via INTRAVENOUS
  Administered 2011-12-21: 50 mL/h via INTRAVENOUS

## 2011-12-21 MED ORDER — ROCURONIUM BROMIDE 100 MG/10ML IV SOLN
INTRAVENOUS | Status: DC | PRN
  Administered 2011-12-21: 30 mg via INTRAVENOUS

## 2011-12-21 MED ORDER — PROPOFOL 10 MG/ML IV EMUL
INTRAVENOUS | Status: AC
  Filled 2011-12-21: qty 20

## 2011-12-21 MED ORDER — BUPIVACAINE HCL (PF) 0.25 % IJ SOLN
INTRAMUSCULAR | Status: AC
  Filled 2011-12-21: qty 30

## 2011-12-21 MED ORDER — NEOSTIGMINE METHYLSULFATE 1 MG/ML IJ SOLN
INTRAMUSCULAR | Status: DC | PRN
  Administered 2011-12-21: 3 mg via INTRAVENOUS

## 2011-12-21 MED ORDER — ONDANSETRON HCL 4 MG/2ML IJ SOLN
INTRAMUSCULAR | Status: DC | PRN
  Administered 2011-12-21: 4 mg via INTRAVENOUS

## 2011-12-21 MED ORDER — MIDAZOLAM HCL 2 MG/2ML IJ SOLN
INTRAMUSCULAR | Status: AC
  Filled 2011-12-21: qty 2

## 2011-12-21 MED ORDER — KETOROLAC TROMETHAMINE 30 MG/ML IJ SOLN
INTRAMUSCULAR | Status: AC
  Filled 2011-12-21: qty 1

## 2011-12-21 MED ORDER — NEOSTIGMINE METHYLSULFATE 1 MG/ML IJ SOLN
INTRAMUSCULAR | Status: AC
  Filled 2011-12-21: qty 10

## 2011-12-21 MED ORDER — PROPOFOL 10 MG/ML IV EMUL
INTRAVENOUS | Status: DC | PRN
  Administered 2011-12-21: 200 mg via INTRAVENOUS

## 2011-12-21 MED ORDER — GLYCOPYRROLATE 0.2 MG/ML IJ SOLN
INTRAMUSCULAR | Status: DC | PRN
  Administered 2011-12-21: 0.4 mg via INTRAVENOUS

## 2011-12-21 MED ORDER — GLYCOPYRROLATE 0.2 MG/ML IJ SOLN
INTRAMUSCULAR | Status: AC
  Filled 2011-12-21: qty 2

## 2011-12-21 MED ORDER — BUPIVACAINE HCL (PF) 0.25 % IJ SOLN
INTRAMUSCULAR | Status: DC | PRN
  Administered 2011-12-21: 10 mL

## 2011-12-21 SURGICAL SUPPLY — 12 items
BANDAGE ADHESIVE 1X3 (GAUZE/BANDAGES/DRESSINGS) ×1 IMPLANT
BLADE SURG 15 STRL LF C SS BP (BLADE) ×1 IMPLANT
BLADE SURG 15 STRL SS (BLADE) ×2
CATH ROBINSON RED A/P 16FR (CATHETERS) ×2 IMPLANT
CLOTH BEACON ORANGE TIMEOUT ST (SAFETY) ×2 IMPLANT
GLOVE BIO SURGEON STRL SZ7.5 (GLOVE) ×4 IMPLANT
GOWN PREVENTION PLUS LG XLONG (DISPOSABLE) ×4 IMPLANT
GOWN PREVENTION PLUS XXLARGE (GOWN DISPOSABLE) ×2 IMPLANT
PACK LAPAROSCOPY BASIN (CUSTOM PROCEDURE TRAY) ×2 IMPLANT
SUT VICRYL 4-0 PS2 18IN ABS (SUTURE) ×2 IMPLANT
TOWEL OR 17X24 6PK STRL BLUE (TOWEL DISPOSABLE) ×4 IMPLANT
WATER STERILE IRR 1000ML POUR (IV SOLUTION) ×2 IMPLANT

## 2011-12-21 NOTE — Anesthesia Preprocedure Evaluation (Addendum)

## 2011-12-21 NOTE — H&P (Signed)
Status unchanged will proceed with laparoscopic BTL.

## 2011-12-21 NOTE — Anesthesia Postprocedure Evaluation (Signed)
  Anesthesia Post-op Note  Patient: Eileen Cisneros  Procedure(s) Performed: Procedure(s) (LRB) with comments: LAPAROSCOPIC TUBAL LIGATION (Bilateral) Patient is awake and responsive. Pain and nausea are reasonably well controlled. Vital signs are stable and clinically acceptable. Oxygen saturation is clinically acceptable. There are no apparent anesthetic complications at this time. Patient is ready for discharge.

## 2011-12-21 NOTE — Brief Op Note (Signed)
12/21/2011  1:09 PM  PATIENT:  Eileen Cisneros  41 y.o. female  PRE-OPERATIVE DIAGNOSIS:  DESIRES STERILIZATION  POST-OPERATIVE DIAGNOSIS:  Desires sterilization  PROCEDURE:  Procedure(s) (LRB) with comments: LAPAROSCOPIC TUBAL LIGATION (Bilateral)  SURGEON:  Surgeon(s) and Role:    * Miguel Aschoff, MD - Primary  ANESTHESIA:   general  EBL:  Total I/O In: 1000 [I.V.:1000] Out: 105 [Urine:100; Blood:5]  BLOOD ADMINISTERED:none  DRAINS: none   LOCAL MEDICATIONS USED:  MARCAINE     SPECIMEN:  No Specimen  DISPOSITION OF SPECIMEN:  N/A  COUNTS:  YES  TOURNIQUET:  * No tourniquets in log *  DICTATION: .Other Dictation: Dictation Number [id number]  PLAN OF CARE: Discharge to home after PACU  PATIENT DISPOSITION:  PACU - hemodynamically stable.   Delay start of Pharmacological VTE agent (>24hrs) due to surgical blood loss or risk of bleeding: PAS hose applied.

## 2011-12-21 NOTE — Transfer of Care (Signed)
Immediate Anesthesia Transfer of Care Note  Patient: Eileen Cisneros  Procedure(s) Performed: Procedure(s) (LRB) with comments: LAPAROSCOPIC TUBAL LIGATION (Bilateral)  Patient Location: PACU  Anesthesia Type:General  Level of Consciousness: awake, alert  and oriented  Airway & Oxygen Therapy: Patient Spontanous Breathing and Patient connected to nasal cannula oxygen  Post-op Assessment: Report given to PACU RN and Post -op Vital signs reviewed and stable  Post vital signs: stable  Complications: No apparent anesthesia complications

## 2011-12-22 NOTE — Op Note (Signed)
[name], Eileen Cisneros NO.:  [id number]  MEDICAL RECORD NO.:  [id number]  LOCATION:  [facility]                          [facility]ILITY:  [facility]  PHYSICIAN:  Miguel Aschoff, M.D.       DATE OF BIRTH:  Apr 10, 1970  DATE OF PROCEDURE:  12/21/2011 DATE OF DISCHARGE:  12/21/2011                              OPERATIVE REPORT   PREOPERATIVE DIAGNOSIS:  Desired sterilization.  POSTOPERATIVE DIAGNOSIS:  Desired sterilization.  PROCEDURE:  Laparoscopic tubal sterilization using fulguration and division.  SURGEON:  Miguel Aschoff, MD  ANESTHESIA:  General.  COMPLICATIONS:  None.  JUSTIFICATION:  The patient is a 41 year old white female, requesting that permanent sterilization be performed for socioeconomic reasons. She understands that this would result in her sterility but that this cannot be guaranteed and that there is a 1 in 300 failure rate.  With this information, she has given her informed consent for laparoscopic tubal sterilization.  PROCEDURE:  The patient was taken to the operating room, placed in the supine position and general anesthesia was administered without difficulty.  She was then placed in the dorsal lithotomy position. Prepped and draped in the usual sterile fashion.  Once this was done, the bladder was catheterized.  Speculum was placed in the vaginal vault. Anterior cervical lip grasped with tenaculum and then a Hulka tenaculum was placed through the cervix and held for manipulation.  Attention was then directed to the abdomen where a small infraumbilical incision was made.  A Veress needle was inserted and then the abdomen was insufflated with 3 L of CO2.  Following the insufflation, the trocar to laparoscope was placed followed by laparoscope itself.  Systematic inspection of the pelvic organs showed the uterus to be normal size and shape.  Tubes were normal along the course.  The fimbriae were fine and delicate.  The ovaries were noted to be totally within  normal limits.  The cul-de-sac was unremarkable.  The liver was inspected was noted to be within normal limits and no other abnormalities were noted.  At this point, the bipolar Kleppinger forceps were introduced through the operating channel of the laparoscope and the midportion of each tube was grasped and cauterized for approximately 3 cm.  After this was done, laparoscopic scissors were introduced and the tubes were divided with good separation, good hemostasis.  At this point, it was elected to complete the procedure.  The CO2 was allowed to escape.  All instruments removed. The small incision was closed using subcuticular 3-0 Vicryl.  The port site was then injected with 0.25% Marcaine for postop analgesia.  The patient tolerated the procedure well and went to the recovery room in satisfactory condition.  ESTIMATED BLOOD LOSS:  Minimal.  Plan is for the patient to be discharged home.  She will be seen back in 4 weeks for followup examination.  She is to call for any problems such as fever, pain, or heavy bleeding.     Miguel Aschoff, M.D.     AR/MEDQ  D:  12/21/2011  T:  12/22/2011  Job:  [id number]

## 2011-12-24 ENCOUNTER — Encounter (HOSPITAL_COMMUNITY): Payer: Self-pay | Admitting: Obstetrics and Gynecology

## 2012-01-10 ENCOUNTER — Ambulatory Visit
Admission: RE | Admit: 2012-01-10 | Discharge: 2012-01-10 | Disposition: A | Discharge status: Home / Self Care | Payer: BC Managed Care – PPO | Source: Ambulatory Visit | Attending: Obstetrics & Gynecology | Admitting: Obstetrics & Gynecology

## 2012-01-10 DIAGNOSIS — Z1231 Encounter for screening mammogram for malignant neoplasm of breast: Secondary | ICD-10-CM

## 2012-06-02 ENCOUNTER — Encounter (HOSPITAL_COMMUNITY): Payer: Self-pay | Admitting: Emergency Medicine

## 2012-06-02 ENCOUNTER — Emergency Department (HOSPITAL_COMMUNITY)
Admission: EM | Admit: 2012-06-02 | Discharge: 2012-06-02 | Disposition: A | Discharge status: Home / Self Care | Payer: No Typology Code available for payment source | Attending: Emergency Medicine | Admitting: Emergency Medicine

## 2012-06-02 DIAGNOSIS — Y939 Activity, unspecified: Secondary | ICD-10-CM | POA: Insufficient documentation

## 2012-06-02 DIAGNOSIS — X503XXA Overexertion from repetitive movements, initial encounter: Secondary | ICD-10-CM | POA: Insufficient documentation

## 2012-06-02 DIAGNOSIS — IMO0002 Reserved for concepts with insufficient information to code with codable children: Secondary | ICD-10-CM | POA: Insufficient documentation

## 2012-06-02 DIAGNOSIS — M7918 Myalgia, other site: Secondary | ICD-10-CM

## 2012-06-02 DIAGNOSIS — Y929 Unspecified place or not applicable: Secondary | ICD-10-CM | POA: Insufficient documentation

## 2012-06-02 DIAGNOSIS — F17200 Nicotine dependence, unspecified, uncomplicated: Secondary | ICD-10-CM | POA: Insufficient documentation

## 2012-06-02 MED ORDER — IBUPROFEN 600 MG PO TABS: 600.0000 mg | ORAL_TABLET | Freq: Four times a day (QID) | ORAL | Status: DC | PRN

## 2012-06-02 MED ORDER — HYDROCODONE-ACETAMINOPHEN 5-325 MG PO TABS: 1.0000 | ORAL_TABLET | ORAL | Status: DC | PRN

## 2012-06-02 MED ORDER — METHOCARBAMOL 500 MG PO TABS: 500.0000 mg | ORAL_TABLET | Freq: Two times a day (BID) | ORAL | Status: DC

## 2012-06-02 NOTE — ED Notes (Signed)
MD at bedside. 

## 2012-06-02 NOTE — ED Notes (Signed)
Pt c/o lower back pain x 10 days after straining back; pt sts not getting better

## 2012-06-02 NOTE — ED Provider Notes (Signed)
History  This chart was scribed for Loren Racer, MD by Shari Heritage, ED Scribe. The patient was seen in room TR08C/TR08C. Patient's care was started at 1532.   CSN: [id number]  Arrival date & time 06/02/12  1345   First MD Initiated Contact with Patient 06/02/12 1532      Chief Complaint  Patient presents with  . Back Pain    Patient is a 42 y.o. female presenting with back pain. The history is provided by the patient. No language interpreter was used.  Back Pain Location:  Lumbar spine Radiates to:  Does not radiate Pain severity:  Moderate Onset quality:  Gradual Duration:  10 days Timing:  Constant Progression:  Improving Chronicity:  New Context: recent injury   Ineffective treatments:  Muscle relaxants and narcotics Associated symptoms: no bladder incontinence, no bowel incontinence, no fever, no numbness and no weakness      HPI Comments: Eileen Cisneros is a 42 y.o. female with history of chronic back pain who presents to the Emergency Department complaining of acute exacerbation of right lower back pain that is gradually improving and constant. Pain began 12 days ago after patient strained her back while bending over to retrieve a pair of heels from her closet. She states that pain gradually developed for a few hours after initial injury and his been constant since. She says that initially, her activities were limited secondary to pain, but symptoms are improving she is now able to ambulate and move all extremities. Patient denies fever, chills, weakness or numbness of the extremities, bowel or bladder incontinence, nausea or vomiting. She has been taking Vicodin 5-325 mg and Flexeril 5 mg for pain. Patient has a history neck surgery. She is a current every day smoker.  Neurosurgeon - [place]  Past Medical History  Diagnosis Date  . Back pain   . No pertinent past medical history     Past Surgical History  Procedure Laterality Date  . Neck surgery    .  Laparoscopic tubal ligation  12/21/2011    Procedure: LAPAROSCOPIC TUBAL LIGATION;  Surgeon: Miguel Aschoff, MD;  Location: [facility];  Service: Gynecology;  Laterality: Bilateral;    History reviewed. No pertinent family history.  History  Substance Use Topics  . Smoking status: Current Every Day Smoker -- 0.50 packs/day  . Smokeless tobacco: Not on file  . Alcohol Use: No    OB History   Grav Para Term Preterm Abortions TAB SAB Ect Mult Living                  Review of Systems  Constitutional: Negative for fever and chills.  Gastrointestinal: Negative for nausea, vomiting and bowel incontinence.  Genitourinary: Negative for bladder incontinence.  Musculoskeletal: Positive for back pain.  Neurological: Negative for weakness and numbness.    Allergies  Septra and Sulfa antibiotics  Home Medications   Current Outpatient Rx  Name  Route  Sig  Dispense  Refill  . cyclobenzaprine (FLEXERIL) 5 MG tablet   Oral   Take 5 mg by mouth 3 (three) times daily as needed for muscle spasms.         Marland Kitchen HYDROcodone-acetaminophen (NORCO/VICODIN) 5-325 MG per tablet   Oral   Take 1 tablet by mouth every 6 (six) hours as needed for pain.           Triage Vitals: BP 111/75  Pulse 110  Temp(Src) 98.5 F (36.9 C) (Oral)  Resp 18  SpO2 98%  Physical Exam  Constitutional: She is oriented to person, place, and time. She appears well-developed and well-nourished.  HENT:  Head: Normocephalic and atraumatic.  Eyes: Conjunctivae and EOM are normal. Pupils are equal, round, and reactive to light.  Neck: Normal range of motion. Neck supple.  Cardiovascular: Exam reveals no friction rub.   Pulmonary/Chest: No respiratory distress.  Musculoskeletal:  Pain localized left paraspinal, lumbar and sacral area. Negative straight leg raise. Neurovascularly intact. No midline tenderness.  Neurological: She is alert and oriented to person, place, and time.  Skin: Skin is warm and dry.  Psychiatric:  She has a normal mood and affect. Her behavior is normal.    ED Course  Procedures (including critical care time) DIAGNOSTIC STUDIES: Oxygen Saturation is 98% on room air, normal by my interpretation.    COORDINATION OF CARE: 3:36 PM- Patient presents with right lower back pain onset 12 days ago after bending down to lift an item. Suspect muscular strain. Advised patient to allow injury to heal and follow up with Dr. Jeral Fruit as needed. Will prescribe Robaxin 500 mg, ibuprofen 600 mg and Norco 5-325 mg to treat. Patient informed of current plan for treatment and evaluation and agrees with plan at this time.     1. Lumbar muscle pain       MDM  I personally performed the services described in this documentation, which was scribed in my presence. The recorded information has been reviewed and is accurate.     Loren Racer, MD 06/02/12 [phone]

## 2015-05-26 ENCOUNTER — Encounter (HOSPITAL_COMMUNITY): Payer: Self-pay | Admitting: Emergency Medicine

## 2015-05-26 ENCOUNTER — Ambulatory Visit (HOSPITAL_COMMUNITY)
Admission: EM | Admit: 2015-05-26 | Discharge: 2015-05-26 | Disposition: A | Discharge status: Home / Self Care | Payer: No Typology Code available for payment source | Attending: Family Medicine | Admitting: Family Medicine

## 2015-05-26 DIAGNOSIS — R197 Diarrhea, unspecified: Secondary | ICD-10-CM | POA: Insufficient documentation

## 2015-05-26 DIAGNOSIS — F17210 Nicotine dependence, cigarettes, uncomplicated: Secondary | ICD-10-CM | POA: Insufficient documentation

## 2015-05-26 DIAGNOSIS — Z882 Allergy status to sulfonamides status: Secondary | ICD-10-CM | POA: Insufficient documentation

## 2015-05-26 LAB — C DIFFICILE QUICK SCREEN W PCR REFLEX
C DIFFICILE (CDIFF) INTERP: NEGATIVE
C DIFFICILE (CDIFF) TOXIN: NEGATIVE
C Diff antigen: NEGATIVE

## 2015-05-26 MED ORDER — DIPHENOXYLATE-ATROPINE 2.5-0.025 MG PO TABS: 1.0000 | ORAL_TABLET | Freq: Four times a day (QID) | ORAL | Status: DC | PRN

## 2015-05-26 NOTE — Discharge Instructions (Signed)
Chronic Diarrhea Diarrhea is frequent loose and watery bowel movements. It can cause you to feel weak and dehydrated. Dehydration can cause you to become tired and thirsty and to have a dry mouth, decreased urination, and dark yellow urine. Diarrhea is a sign of another problem, most often an infection that will not last long. In most cases, diarrhea lasts 2-3 days. Diarrhea that lasts longer than 4 weeks is called long-lasting (chronic) diarrhea. It is important to treat your diarrhea as directed by your health care provider to lessen or prevent future episodes of diarrhea.  CAUSES  There are many causes of chronic diarrhea. The following are some possible causes:   Gastrointestinal infections caused by viruses, bacteria, or parasites.   Food poisoning or food allergies.   Certain medicines, such as antibiotics, chemotherapy, and laxatives.   Artificial sweeteners and fructose.   Digestive disorders, such as celiac disease and inflammatory bowel diseases.   Irritable bowel syndrome.  Some disorders of the pancreas.  Disorders of the thyroid.  Reduced blood flow to the intestines.  Cancer. Sometimes the cause of chronic diarrhea is unknown. RISK FACTORS  Having a severely weakened immune system, such as from HIV or AIDS.   Taking certain types of cancer-fighting drugs (such as with chemotherapy) or other medicines.   Having had a recent organ transplant.   Having a portion of the stomach or small bowel removed.   Traveling to countries where food and water supplies are often contaminated.  SYMPTOMS  In addition to frequent, loose stools, diarrhea may cause:   Cramping.   Abdominal pain.   Nausea.   Fever.  Fatigue.  Urgent need to use the bathroom.  Loss of bowel control. DIAGNOSIS  Your health care provider must take a careful history and perform a physical exam. Tests given are based on your symptoms and history. Tests may include:   Blood or  stool tests. Three or more stool samples may be examined. Stool cultures may be used to test for bacteria or parasites.   X-rays.   A procedure in which a thin tube is inserted into the mouth or rectum (endoscopy). This allows the health care provider to look inside the intestine.  TREATMENT   Treatment is aimed at correcting the cause of the diarrhea when possible.  Diarrhea caused by an infection can often be treated with antibiotic medicines.  Diarrhea not caused by an infection may require you to take long-term medicine or have surgery. Specific treatment should be discussed with your health care provider.  If the cause cannot be determined, treatment aims to relieve symptoms and prevent dehydration. Serious health problems can occur if you do not maintain proper fluid levels. Treatment may include:  Taking an oral rehydration solution (ORS).  Not drinking beverages that contain caffeine (such as tea, coffee, and soft drinks).  Not drinking alcohol.  Maintaining well-balanced nutrition to help you recover faster. HOME CARE INSTRUCTIONS   Drink enough fluids to keep urine clear or pale yellow. Drink 1 cup (8 oz) of fluid for each diarrhea episode. Avoid fluids that contain simple sugars, fruit juices, whole milk products, and sodas. Hydrate with an ORS. You may purchase the ORS or prepare it at home by mixing the following ingredients together:   - tsp (1.7-3  mL) table salt.   tsp (3  mL) baking soda.   tsp (1.7 mL) salt substitute containing potassium chloride.  1 tbsp (20 mL) sugar.  4.2 c (1 L) of water.  Certain foods and beverages may increase the speed at which food moves through the gastrointestinal (GI) tract. These foods and beverages should be avoided. They include:  Caffeinated and alcoholic beverages.  High-fiber foods, such as raw fruits and vegetables, nuts, seeds, and whole grain breads and cereals.  Foods and beverages sweetened with sugar  alcohols, such as xylitol, sorbitol, and mannitol.   Some foods may be well tolerated and may help thicken stool. These include:  Starchy foods, such as rice, toast, pasta, low-sugar cereal, oatmeal, grits, baked potatoes, crackers, and bagels.  Bananas.  Applesauce.  Add probiotic-rich foods to help increase healthy bacteria in the GI tract. These include yogurt and fermented milk products.  Wash your hands well after each diarrhea episode.  Only take over-the-counter or prescription medicines as directed by your health care provider.  Take a warm bath to relieve any burning or pain from frequent diarrhea episodes. SEEK MEDICAL CARE IF:   You are not urinating as often.  Your urine is a dark color.  You become very tired or dizzy.  You have severe pain in the abdomen or rectum.  Your have blood or pus in your stools.  Your stools look black and tarry. SEEK IMMEDIATE MEDICAL CARE IF:   You are unable to keep fluids down.  You have persistent vomiting.  You have blood in your stool.  Your stools are black and tarry.  You do not urinate in 6-8 hours, or there is only a small amount of very dark urine.  You have abdominal pain that increases or localizes.  You have weakness, dizziness, confusion, or lightheadedness.  You have a severe headache.  Your diarrhea gets worse or does not get better.  You have a fever or persistent symptoms for more than 2-3 days.  You have a fever and your symptoms suddenly get worse. MAKE SURE YOU:   Understand these instructions.  Will watch your condition.  Will get help right away if you are not doing well or get worse.   This information is not intended to replace advice given to you by your health care provider. Make sure you discuss any questions you have with your health care provider.   Document Released: 03/24/2003 Document Revised: 01/06/2013 Document Reviewed: 06/26/2012 Elsevier Interactive Patient Education 2016  Elsevier Inc[employer] Food Choices to Help Relieve Diarrhea, Adult When you have diarrhea, the foods you eat and your eating habits are very important. Choosing the right foods and drinks can help relieve diarrhea. Also, because diarrhea can last up to 7 days, you need to replace lost fluids and electrolytes (such as sodium, potassium, and chloride) in order to help prevent dehydration.  WHAT GENERAL GUIDELINES DO I NEED TO FOLLOW?  Slowly drink 1 cup (8 oz) of fluid for each episode of diarrhea. If you are getting enough fluid, your urine will be clear or pale yellow.  Eat starchy foods. Some good choices include white rice, white toast, pasta, low-fiber cereal, baked potatoes (without the skin), saltine crackers, and bagels.  Avoid large servings of any cooked vegetables.  Limit fruit to two servings per day. A serving is  cup or 1 small piece.  Choose foods with less than 2 g of fiber per serving.  Limit fats to less than 8 tsp (38 g) per day.  Avoid fried foods.  Eat foods that have probiotics in them. Probiotics can be found in certain dairy products.  Avoid foods and beverages that may increase the speed at which food   moves through the stomach and intestines (gastrointestinal tract). Things to avoid include:  High-fiber foods, such as dried fruit, raw fruits and vegetables, nuts, seeds, and whole grain foods.  Spicy foods and high-fat foods.  Foods and beverages sweetened with high-fructose corn syrup, honey, or sugar alcohols such as xylitol, sorbitol, and mannitol. WHAT FOODS ARE RECOMMENDED? Grains White rice. White, French, or pita breads (fresh or toasted), including plain rolls, buns, or bagels. White pasta. Saltine, soda, or graham crackers. Pretzels. Low-fiber cereal. Cooked cereals made with water (such as cornmeal, farina, or cream cereals). Plain muffins. Matzo. Melba toast. Zwieback.  Vegetables Potatoes (without the skin). Strained tomato and vegetable juices. Most  well-cooked and canned vegetables without seeds. Tender lettuce. Fruits Cooked or canned applesauce, apricots, cherries, fruit cocktail, grapefruit, peaches, pears, or plums. Fresh bananas, apples without skin, cherries, grapes, cantaloupe, grapefruit, peaches, oranges, or plums.  Meat and Other Protein Products Baked or boiled chicken. Eggs. Tofu. Fish. Seafood. Smooth peanut butter. Ground or well-cooked tender beef, ham, veal, lamb, pork, or poultry.  Dairy Plain yogurt, kefir, and unsweetened liquid yogurt. Lactose-free milk, buttermilk, or soy milk. Plain hard cheese. Beverages Sport drinks. Clear broths. Diluted fruit juices (except prune). Regular, caffeine-free sodas such as ginger ale. Water. Decaffeinated teas. Oral rehydration solutions. Sugar-free beverages not sweetened with sugar alcohols. Other Bouillon, broth, or soups made from recommended foods.  The items listed above may not be a complete list of recommended foods or beverages. Contact your dietitian for more options. WHAT FOODS ARE NOT RECOMMENDED? Grains Whole grain, whole wheat, bran, or rye breads, rolls, pastas, crackers, and cereals. Wild or Hillery rice. Cereals that contain more than 2 g of fiber per serving. Corn tortillas or taco shells. Cooked or dry oatmeal. Granola. Popcorn. Vegetables Raw vegetables. Cabbage, broccoli, Brussels sprouts, artichokes, baked beans, beet greens, corn, kale, legumes, peas, sweet potatoes, and yams. Potato skins. Cooked spinach and cabbage. Fruits Dried fruit, including raisins and dates. Raw fruits. Stewed or dried prunes. Fresh apples with skin, apricots, mangoes, pears, raspberries, and strawberries.  Meat and Other Protein Products Chunky peanut butter. Nuts and seeds. Beans and lentils. Bacon.  Dairy High-fat cheeses. Milk, chocolate milk, and beverages made with milk, such as milk shakes. Cream. Ice cream. Sweets and Desserts Sweet rolls, doughnuts, and sweet breads. Pancakes  and waffles. Fats and Oils Butter. Cream sauces. Margarine. Salad oils. Plain salad dressings. Olives. Avocados.  Beverages Caffeinated beverages (such as coffee, tea, soda, or energy drinks). Alcoholic beverages. Fruit juices with pulp. Prune juice. Soft drinks sweetened with high-fructose corn syrup or sugar alcohols. Other Coconut. Hot sauce. Chili powder. Mayonnaise. Gravy. Cream-based or milk-based soups.  The items listed above may not be a complete list of foods and beverages to avoid. Contact your dietitian for more information. WHAT SHOULD I DO IF I BECOME DEHYDRATED? Diarrhea can sometimes lead to dehydration. Signs of dehydration include dark urine and dry mouth and skin. If you think you are dehydrated, you should rehydrate with an oral rehydration solution. These solutions can be purchased at pharmacies, retail stores, or online.  Drink -1 cup (120-240 mL) of oral rehydration solution each time you have an episode of diarrhea. If drinking this amount makes your diarrhea worse, try drinking smaller amounts more often. For example, drink 1-3 tsp (5-15 mL) every 5-10 minutes.  A general rule for staying hydrated is to drink 1-2 L of fluid per day. Talk to your health care provider about the specific amount you should be   drinking each day. Drink enough fluids to keep your urine clear or pale yellow.   This information is not intended to replace advice given to you by your health care provider. Make sure you discuss any questions you have with your health care provider.   Document Released: 03/24/2003 Document Revised: 01/22/2014 Document Reviewed: 11/24/2012 Elsevier Interactive Patient Education 2016 Elsevier Inc. Diarrhea Diarrhea is watery poop (stool). It can make you feel weak, tired, thirsty, or give you a dry mouth (signs of dehydration). Watery poop is a sign of another problem, most often an infection. It often lasts 2-3 days. It can last longer if it is a sign of something  serious. Take care of yourself as told by your doctor. HOME CARE   Drink 1 cup (8 ounces) of fluid each time you have watery poop.  Do not drink the following fluids:  Those that contain simple sugars (fructose, glucose, galactose, lactose, sucrose, maltose).  Sports drinks.  Fruit juices.  Whole milk products.  Sodas.  Drinks with caffeine (coffee, tea, soda) or alcohol.  Oral rehydration solution may be used if the doctor says it is okay. You may make your own solution. Follow this recipe:   - teaspoon table salt.   teaspoon baking soda.   teaspoon salt substitute containing potassium chloride.  1 tablespoons sugar.  1 liter (34 ounces) of water.  Avoid the following foods:  High fiber foods, such as raw fruits and vegetables.  Nuts, seeds, and whole grain breads and cereals.   Those that are sweetened with sugar alcohols (xylitol, sorbitol, mannitol).  Try eating the following foods:  Starchy foods, such as rice, toast, pasta, low-sugar cereal, oatmeal, baked potatoes, crackers, and bagels.  Bananas.  Applesauce.  Eat probiotic-rich foods, such as yogurt and milk products that are fermented.  Wash your hands well after each time you have watery poop.  Only take medicine as told by your doctor.  Take a warm bath to help lessen burning or pain from having watery poop. GET HELP RIGHT AWAY IF:   You cannot drink fluids without throwing up (vomiting).  You keep throwing up.  You have blood in your poop, or your poop looks black and tarry.  You do not pee (urinate) in 6-8 hours, or there is only a small amount of very dark pee.  You have belly (abdominal) pain that gets worse or stays in the same spot (localizes).  You are weak, dizzy, confused, or light-headed.  You have a very bad headache.  Your watery poop gets worse or does not get better.  You have a fever or lasting symptoms for more than 2-3 days.  You have a fever and your symptoms  suddenly get worse. MAKE SURE YOU:   Understand these instructions.  Will watch your condition.  Will get help right away if you are not doing well or get worse.   This information is not intended to replace advice given to you by your health care provider. Make sure you discuss any questions you have with your health care provider.   Document Released: 06/20/2007 Document Revised: 01/22/2014 Document Reviewed: 09/09/2011 Elsevier Interactive Patient Education [employer] Elsevier Inc.

## 2015-05-26 NOTE — ED Provider Notes (Signed)
CSN: [id number]     Arrival date & time 05/26/15  1257 History   First MD Initiated Contact with Patient 05/26/15 1326     Chief Complaint  Patient presents with  . Diarrhea   (Consider location/radiation/quality/duration/timing/severity/associated sxs/prior Treatment) HPI  History obtained from patient:  Pt presents with the cc [id number] Duration of symptoms:Almost 1 week Treatment prior to arrival: Fluids Context: Sudden onset of diarrhea (20 times a day) Taking fluids but no appetite Other symptoms include: Rectal pain from diarrhea Pain score: 2 FAMILY HISTORY: No family history of cancer or diabetes SOCIAL HISTORY: Smoker   Past Medical History  Diagnosis Date  . Back pain   . No pertinent past medical history    Past Surgical History  Procedure Laterality Date  . Neck surgery    . Laparoscopic tubal ligation  12/21/2011    Procedure: LAPAROSCOPIC TUBAL LIGATION;  Surgeon: Miguel Aschoff, MD;  Location: [facility];  Service: Gynecology;  Laterality: Bilateral;   History reviewed. No pertinent family history. Social History  Substance Use Topics  . Smoking status: Current Every Day Smoker -- 0.50 packs/day  . Smokeless tobacco: None  . Alcohol Use: No   OB History    No data available     Review of Systems ROS +'ve diarrhea  Denies: HEADACHE, NAUSEA, ABDOMINAL PAIN, CHEST PAIN, CONGESTION, DYSURIA, SHORTNESS OF BREATH, LIGHTHEADEDNESS, DIZZINESS  Allergies  Septra and Sulfa antibiotics  Home Medications   Prior to Admission medications   Medication Sig Start Date End Date Taking? Authorizing Provider  cyclobenzaprine (FLEXERIL) 5 MG tablet Take 5 mg by mouth 3 (three) times daily as needed for muscle spasms.    Historical Provider, MD  HYDROcodone-acetaminophen (NORCO) 5-325 MG per tablet Take 1 tablet by mouth every 4 (four) hours as needed for pain. 06/02/12   Loren Racer, MD  HYDROcodone-acetaminophen (NORCO/VICODIN) 5-325 MG per tablet Take 1 tablet by  mouth every 6 (six) hours as needed for pain.    Historical Provider, MD  ibuprofen (ADVIL,MOTRIN) 600 MG tablet Take 1 tablet (600 mg total) by mouth every 6 (six) hours as needed for pain. 06/02/12   Loren Racer, MD  methocarbamol (ROBAXIN) 500 MG tablet Take 1 tablet (500 mg total) by mouth 2 (two) times daily. 06/02/12   Loren Racer, MD   Meds Ordered and Administered this Visit  Medications - No data to display  BP 106/74 mmHg  Pulse 90  Temp(Src) 98.6 F (37 C) (Oral)  Resp 16  SpO2 99%  LMP 05/19/2015 (Exact Date) No data found.   Physical Exam NURSES NOTES AND VITAL SIGNS REVIEWED. CONSTITUTIONAL: Well developed, well nourished, no acute distress, mucosa pink and moist HEENT: normocephalic, atraumatic EYES: Conjunctiva normal NECK:normal ROM, supple, no adenopathy PULMONARY:No respiratory distress, normal effort ABDOMINAL: Soft, ND, NT BS+, No CVAT MUSCULOSKELETAL: Normal ROM of all extremities,  SKIN: warm and dry without rash PSYCHIATRIC: Mood and affect, behavior are normal  ED Course  Procedures (including critical care time)  Labs Review Labs Reviewed - No data to display  Imaging Review No results found.   Visual Acuity Review  Right Eye Distance:   Left Eye Distance:   Bilateral Distance:    Right Eye Near:   Left Eye Near:    Bilateral Near:      RX Lomotil   MDM   1. Diarrhea, unspecified type   new problem  Patient is reassured that there are no issues that require transfer to higher level of care at  this time or additional tests. Patient is advised to continue home symptomatic treatment. Patient is advised that if there are new or worsening symptoms to attend the emergency department, contact primary care provider, or return to UC. Instructions of care provided discharged home in stable condition.    THIS NOTE WAS GENERATED USING A VOICE RECOGNITION SOFTWARE PROGRAM. ALL REASONABLE EFFORTS  WERE MADE TO PROOFREAD THIS DOCUMENT  FOR ACCURACY.  I have verbally reviewed the discharge instructions with the patient. A printed AVS was given to the patient.  All questions were answered prior to discharge.      Tharon AquasFrank C Kiffany Schelling, PA 05/26/15 [phone]

## 2015-05-26 NOTE — ED Notes (Signed)
Pt sent home with kit to collect stool sample and return to clinic for testing.

## 2015-05-26 NOTE — ED Notes (Signed)
Pt states she has had diarrhea since Friday night.  She reports going 10 times in 45 minutes.  This is constant, all day for the last seven days.  She has no appetite, but does report drinking plenty of fluids.  She denies fever or vomiting.

## 2015-05-27 ENCOUNTER — Telehealth (HOSPITAL_COMMUNITY): Payer: Self-pay | Admitting: Emergency Medicine

## 2015-05-27 LAB — GASTROINTESTINAL PANEL BY PCR, STOOL (REPLACES STOOL CULTURE)
ADENOVIRUS F40/41: NOT DETECTED
Astrovirus: NOT DETECTED
CAMPYLOBACTER SPECIES: NOT DETECTED
CRYPTOSPORIDIUM: DETECTED — AB
Cyclospora cayetanensis: NOT DETECTED
E. coli O157: NOT DETECTED
ENTAMOEBA HISTOLYTICA: NOT DETECTED
ENTEROAGGREGATIVE E COLI (EAEC): NOT DETECTED
ENTEROPATHOGENIC E COLI (EPEC): NOT DETECTED
Enterotoxigenic E coli (ETEC): NOT DETECTED
GIARDIA LAMBLIA: NOT DETECTED
NOROVIRUS GI/GII: NOT DETECTED
Plesimonas shigelloides: NOT DETECTED
Rotavirus A: NOT DETECTED
SALMONELLA SPECIES: NOT DETECTED
SHIGELLA/ENTEROINVASIVE E COLI (EIEC): NOT DETECTED
Sapovirus (I, II, IV, and V): NOT DETECTED
Shiga like toxin producing E coli (STEC): NOT DETECTED
VIBRIO CHOLERAE: NOT DETECTED
Vibrio species: NOT DETECTED
YERSINIA ENTEROCOLITICA: NOT DETECTED

## 2015-05-27 NOTE — ED Notes (Signed)
Pt called back... Notified her of C-diff results... Adv pt that GI panel is still pending Adv pt if sx are worsening, to return or go to ER Pt verb understanding

## 2015-05-27 NOTE — ED Notes (Signed)
Adv by Alan RipperStasha M to call pt w/results,   LM on pt's VM [phone] Need to give lab results from recent visit on 5/9 Also let pt know labs can be obtained from MyChart  Results are neg for C-Dif

## 2015-05-31 NOTE — ED Notes (Signed)
Called Eileen Cisneros and notified of recent lab results from visit 5/11 Eileen Cisneros ID'd properly... Reports on Monday she was feeling better and went to work but yest the diarrhea continued  Per Dr. 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  Cryptosporidium typically causes non-life-threatening diarrhea, often associated with travel, which will run its course in 10-14 days without treatment.  Should wash hands carefully with soap/water after trips to the bathroom. If stool frequency/consistency are not improving, patient should followup with PCP or infectious disease to discuss further measures.  If worsening abdominal pain or new fever >100.5 occur, patient should go to ER for further evaluation. LM  Adv Eileen Cisneros if sx are not getting better to return  Eileen Cisneros verb understanding [facility]

## 2016-10-31 ENCOUNTER — Emergency Department (HOSPITAL_BASED_OUTPATIENT_CLINIC_OR_DEPARTMENT_OTHER)
Admission: EM | Admit: 2016-10-31 | Discharge: 2016-10-31 | Disposition: A | Discharge status: Home / Self Care | Payer: Self-pay | Attending: Emergency Medicine | Admitting: Emergency Medicine

## 2016-10-31 ENCOUNTER — Encounter (HOSPITAL_BASED_OUTPATIENT_CLINIC_OR_DEPARTMENT_OTHER): Payer: Self-pay

## 2016-10-31 ENCOUNTER — Emergency Department (HOSPITAL_BASED_OUTPATIENT_CLINIC_OR_DEPARTMENT_OTHER): Payer: Self-pay

## 2016-10-31 DIAGNOSIS — M25521 Pain in right elbow: Secondary | ICD-10-CM | POA: Insufficient documentation

## 2016-10-31 DIAGNOSIS — F17200 Nicotine dependence, unspecified, uncomplicated: Secondary | ICD-10-CM | POA: Insufficient documentation

## 2016-10-31 NOTE — ED Triage Notes (Signed)
Pt states she injured right elbow 10 days ago-states pain has become worse-NAD-steady gait

## 2016-10-31 NOTE — ED Provider Notes (Signed)
[facility] EMERGENCY DEPARTMENT Provider Note   CSN: [id number] Arrival date & time: 10/31/16  1528     History   Chief Complaint Chief Complaint  Patient presents with  . Elbow Injury    HPI Eileen Cisneros is a 46 y.o. female.  Patient presents with complaint of right elbow pain and weakness. Patient sustained an injury to the right lateral elbow approximately 10 days ago while sliding down a slide with a granddaughter. She states that she had a slight abrasion to the elbow but it did not hurt that much. Over time, patient developed mild tenderness and trouble with her grip in the right hand. She denies any distal numbness or tingling. She states that she began brushing her teeth with her left hand bc of trouble with worsening pain when she was gripping her toothbrush. She's been taking ibuprofen. Symptoms have not seemed to be really improving. Family encouraged her to come get checked to make sure she did not have a broken bone. Patient denies other injuries. No color change of the hand or fingers. No difficulty with movement or strength in her wrist.      Past Medical History:  Diagnosis Date  . Back pain   . No pertinent past medical history     There are no active problems to display for this patient.   Past Surgical History:  Procedure Laterality Date  . LAPAROSCOPIC TUBAL LIGATION  12/21/2011   Procedure: LAPAROSCOPIC TUBAL LIGATION;  Surgeon: Miguel Aschoff, MD;  Location: [facility];  Service: Gynecology;  Laterality: Bilateral;  . NECK SURGERY      OB History    No data available       Home Medications    Prior to Admission medications   Not on File    Family History No family history on file.  Social History Social History  Substance Use Topics  . Smoking status: Current Every Day Smoker    Packs/day: 0.50  . Smokeless tobacco: Never Used  . Alcohol use No     Allergies   Septra [sulfamethoxazole-trimethoprim] and Sulfa  antibiotics   Review of Systems Review of Systems  Constitutional: Negative for activity change.  Musculoskeletal: Positive for arthralgias and myalgias. Negative for back pain, joint swelling and neck pain.  Skin: Negative for wound.  Neurological: Negative for weakness and numbness.     Physical Exam Updated Vital Signs BP 134/80 (BP Location: Left Arm)   Pulse 88   Temp 98.7 F (37.1 C) (Oral)   Resp 18   Ht 5\' 4"  (1.626 m)   Wt 68 kg (150 lb)   LMP 10/29/2016   SpO2 100%   BMI 25.75 kg/m   Physical Exam  Constitutional: She appears well-developed and well-nourished.  HENT:  Head: Normocephalic and atraumatic.  Eyes: Pupils are equal, round, and reactive to light.  Neck: Normal range of motion. Neck supple.  Cardiovascular: Normal pulses.  Exam reveals no decreased pulses.   Pulses:      Radial pulses are 2+ on the right side, and 2+ on the left side.  Musculoskeletal: She exhibits tenderness. She exhibits no edema.       Right shoulder: Normal.       Right elbow: She exhibits normal range of motion and no swelling. Tenderness (lateral proximal forearm) found.       Right wrist: Normal. She exhibits normal range of motion, no tenderness and no bony tenderness.       Right forearm: She  exhibits no tenderness, no bony tenderness and no swelling.       Right hand: Normal.  Neurological: She is alert. No sensory deficit.  Motor, sensation, and vascular distal to the injury is fully intact.   Skin: Skin is warm and dry.  Psychiatric: She has a normal mood and affect.  Nursing note and vitals reviewed.    ED Treatments / Results  Labs (all labs ordered are listed, but only abnormal results are displayed) Labs Reviewed - No data to display  EKG  EKG Interpretation None       Radiology Dg Elbow Complete Right  Result Date: 10/31/2016 CLINICAL DATA:  Elbow injury EXAM: RIGHT ELBOW - COMPLETE 3+ VIEW COMPARISON:  None. FINDINGS: There is no evidence of  fracture, dislocation, or joint effusion. There is no evidence of arthropathy or other focal bone abnormality. Soft tissues are unremarkable. IMPRESSION: Negative. Electronically Signed   By: Deatra RobinsonKevin  Herman M.D.   On: 10/31/2016 15:56    Procedures Procedures (including critical care time)  Medications Ordered in ED Medications - No data to display   Initial Impression / Assessment and Plan / ED Course  I have reviewed the triage vital signs and the nursing notes.  Pertinent labs & imaging results that were available during my care of the patient were reviewed by me and considered in my medical decision making (see chart for details).     Patient seen and examined.   Vital signs reviewed and are as follows: BP 134/80 (BP Location: Left Arm)   Pulse 88   Temp 98.7 F (37.1 C) (Oral)   Resp 18   Ht 5\' 4"  (1.626 m)   Wt 68 kg (150 lb)   LMP 10/29/2016   SpO2 100%   BMI 25.75 kg/m   X-rays reviewed by myself. Negative for fracture or other injury.Patient encouraged to follow-up with orthopedist in one week if not improved. She will continue NSAIDs and conservative measures.  Final Clinical Impressions(s) / ED Diagnoses   Final diagnoses:  Right elbow pain   Patient with right elbow symptoms. Suspect tendinitis given tenderness over muscular insertion point. No significant swelling or effusion. No distal paresthesias to suggest neuropathy due to injury. We'll continue conservative management with follow-up if not starting to improve.   New Prescriptions New Prescriptions   No medications on file     Renne CriglerGeiple, Shetara Launer, Cordelia Poche-C 10/31/16 1612    Lavera GuiseLiu, Dana Duo, MD 10/31/16 [phone]

## 2016-10-31 NOTE — ED Notes (Signed)
Patient transported to X-ray 

## 2016-10-31 NOTE — Discharge Instructions (Signed)
Please read and follow all provided instructions.  Your diagnoses today include:  1. Right elbow pain    Tests performed today include:  An x-ray of the affected area - does NOT show any broken bones  Vital signs. See below for your results today.   Medications prescribed:   None  Take any prescribed medications only as directed.  Home care instructions:   Follow any educational materials contained in this packet  Continue to use NSAID medication such as ibuprofen or naproxen at home for inflammation and pain.  Follow R.I.C.E. Protocol:  R - rest your injury   I  - use ice on injury without applying directly to skin  C - compress injury with bandage or splint  E - elevate the injury as much as possible  Follow-up instructions: Please follow-up with the provided orthopedic physician (bone specialist).   Return instructions:   Please return if your fingers are numb or tingling, appear gray or blue, or you have severe pain (also elevate the arm and loosen splint or wrap if you were given one)  Please return to the Emergency Department if you experience worsening symptoms.   Please return if you have any other emergent concerns.  Additional Information:  Your vital signs today were: BP 134/80 (BP Location: Left Arm)    Pulse 88    Temp 98.7 F (37.1 C) (Oral)    Resp 18    Ht 5\' 4"  (1.626 m)    Wt 68 kg (150 lb)    LMP 10/29/2016    SpO2 100%    BMI 25.75 kg/m  If your blood pressure (BP) was elevated above 135/85 this visit, please have this repeated by your doctor within one month. --------------

## 2016-10-31 NOTE — ED Notes (Signed)
ED Provider at bedside. 

## 2016-11-12 ENCOUNTER — Encounter: Payer: Self-pay | Admitting: Family Medicine

## 2016-11-12 ENCOUNTER — Ambulatory Visit (INDEPENDENT_AMBULATORY_CARE_PROVIDER_SITE_OTHER): Payer: Self-pay | Admitting: Family Medicine

## 2016-11-12 DIAGNOSIS — M7711 Lateral epicondylitis, right elbow: Secondary | ICD-10-CM

## 2016-11-12 MED ORDER — NITROGLYCERIN 0.2 MG/HR TD PT24: MEDICATED_PATCH | TRANSDERMAL | 1 refills | Status: AC

## 2016-11-12 NOTE — Patient Instructions (Signed)
You have lateral epicondylitis Try to avoid painful activities as much as possible. Ice the area 3-4 times a day for 15 minutes at a time. Aleve 2 tabs twice a day with food OR ibuprofen 600mg  three times a day with food for pain and inflammation. Nitro patches 1/4th patch to affected elbow, change daily. Counterforce brace as directed can help unload area - wear this regularly if it provides you with relief. Wear wrist brace as well to help rest this - definitely wear at work but consider wearing at all times until pain level comes down. Hammer rotation exercise, wrist extension exercise without weights first then advanced to using 1 pound weight - 3 sets of 10 once a day.   Stretching - hold for 20-30 seconds and repeat 3 times. Consider physical therapy, injection if not improving. Follow up in 6 weeks but call me sooner if you're struggling and want to try an injection.

## 2016-11-13 DIAGNOSIS — M7711 Lateral epicondylitis, right elbow: Secondary | ICD-10-CM | POA: Insufficient documentation

## 2016-11-13 NOTE — Assessment & Plan Note (Signed)
reviewed home exercises to do daily.  Nitro patches - discussed risks of headache, skin irritation.  Icing, aleve or ibuprofen.  Counterforce brace and wrist brace.  Consider PT, injection if not improving.  F/u in 6 weeks.

## 2016-11-13 NOTE — Progress Notes (Signed)
PCP: Patient, No Pcp Per  Subjective:   HPI: Patient is a 46 y.o. female here for right elbow pain.  Patient reports about 3 weeks ago she started to get pain lateral right elbow and forearm. She recalls about 1 week before this she hit this elbow while going down a slide but no pain, only an abrasion at the time. Pain is constant and up to 7/10, sharp. Worse with opening jar, cup. No skin changes, numbness aside from the abrasion. Right handed.  Past Medical History:  Diagnosis Date  . Back pain   . No pertinent past medical history     No current outpatient prescriptions on file prior to visit.   No current facility-administered medications on file prior to visit.     Past Surgical History:  Procedure Laterality Date  . LAPAROSCOPIC TUBAL LIGATION  12/21/2011   Procedure: LAPAROSCOPIC TUBAL LIGATION;  Surgeon: Miguel AschoffAllan Ross, MD;  Location: [facility];  Service: Gynecology;  Laterality: Bilateral;  . NECK SURGERY      Allergies  Allergen Reactions  . Septra [Sulfamethoxazole-Trimethoprim] Hives and Other (See Comments)    Childhood reaction Hallucinations  . Sulfa Antibiotics Hives and Other (See Comments)    Hallucinations.     Social History   Social History  . Marital status: Legally Separated    Spouse name: N/A  . Number of children: N/A  . Years of education: N/A   Occupational History  . Not on file.   Social History Main Topics  . Smoking status: Current Every Day Smoker    Packs/day: 0.50  . Smokeless tobacco: Never Used  . Alcohol use No  . Drug use: No  . Sexual activity: Not on file   Other Topics Concern  . Not on file   Social History Narrative  . No narrative on file    No family history on file.  BP 112/78   Pulse 89   Ht 5\' 5"  (1.651 m)   Wt 150 lb (68 kg)   LMP 10/29/2016   BMI 24.96 kg/m   Review of Systems: See HPI above.     Objective:  Physical Exam:  Gen: NAD, comfortable in exam room  Right elbow: No gross  deformity, swelling, bruising. TTP lateral epicondyle and into extensors of forearm.  No other tenderness. FROM elbow without pain.  5/5 strength.  FROM wrist but pain with supination, wrist extension Collateral ligaments intact. NVI distally.  Left elbow: No gross deformity, swelling, bruising. No TTP. FROM with 5/5 strength, no pain. Collateral ligaments intact. NVI distally.   Assessment & Plan:  1. Right lateral epicondylitis - reviewed home exercises to do daily.  Nitro patches - discussed risks of headache, skin irritation.  Icing, aleve or ibuprofen.  Counterforce brace and wrist brace.  Consider PT, injection if not improving.  F/u in 6 weeks.

## 2016-11-23 ENCOUNTER — Telehealth: Payer: Self-pay | Admitting: Family Medicine

## 2016-11-23 MED ORDER — DICLOFENAC SODIUM 75 MG PO TBEC: 75.0000 mg | DELAYED_RELEASE_TABLET | Freq: Two times a day (BID) | ORAL | 1 refills | Status: AC

## 2016-11-23 NOTE — Telephone Encounter (Signed)
Spoke to patient and she would like to try the diclofenac. Please send [address] [facility] [address] Will let us know about the injection.

## 2016-11-23 NOTE — Telephone Encounter (Signed)
Patient was seen on 10/29 for elbow pain.  She has been icing, wearing sling, etc.  She is in a lot of pain - hurts like a bad toothache - and the ibuprofen hasn't touched it.  She is unable to sleep / get comfortable.  Is there something she can use /get to help her with the pain?

## 2016-11-23 NOTE — Telephone Encounter (Signed)
We could try a different anti-inflammatory like diclofenac twice a day with food.  Injection is an option also if she can come in early next week.

## 2016-11-23 NOTE — Telephone Encounter (Signed)
Sent in the diclofenac - thanks!

## 2016-11-23 NOTE — Telephone Encounter (Signed)
Spoke to patient and told her that the prescription has been sent to her pharmacy.

## 2016-12-12 ENCOUNTER — Ambulatory Visit (INDEPENDENT_AMBULATORY_CARE_PROVIDER_SITE_OTHER): Payer: Self-pay | Admitting: Family Medicine

## 2016-12-12 ENCOUNTER — Encounter: Payer: Self-pay | Admitting: Family Medicine

## 2016-12-12 DIAGNOSIS — M654 Radial styloid tenosynovitis [de Quervain]: Secondary | ICD-10-CM

## 2016-12-12 MED ORDER — METHYLPREDNISOLONE ACETATE 40 MG/ML IJ SUSP
20.0000 mg | Freq: Once | INTRAMUSCULAR | Status: AC
  Administered 2016-12-12: 20 mg via INTRA_ARTICULAR

## 2016-12-12 MED ORDER — HYDROCODONE-ACETAMINOPHEN 5-325 MG PO TABS: 1.0000 | ORAL_TABLET | Freq: Four times a day (QID) | ORAL | 0 refills | Status: AC | PRN

## 2016-12-12 NOTE — Patient Instructions (Signed)
You have deQuervain's tenosynovitis of your thumb/wrist. Avoid painful activities as much as possible. Wear the thumb spica brace as often as possible to rest this. Ice 15 minutes at a time 3-4 times a day. A cortisone injection typically helps a great deal with this and is an option - you were given this today Follow up with me in 1 month for reevaluation. 

## 2016-12-13 ENCOUNTER — Encounter: Payer: Self-pay | Admitting: Family Medicine

## 2016-12-13 DIAGNOSIS — M654 Radial styloid tenosynovitis [de Quervain]: Secondary | ICD-10-CM | POA: Insufficient documentation

## 2016-12-13 NOTE — Progress Notes (Signed)
PCP: Patient, No Pcp Per  Subjective:   HPI: Patient is a 46 y.o. female here for left wrist pain.  Patient is still having problems with her right elbow at 7/10 level but over past couple weeks has developed radial sided left wrist pain. Pain level 10/10 and sharp. She is right handed. Pain worse with any motions. Hasn't tried anything for this. No skin changes, numbness.  Past Medical History:  Diagnosis Date  . Back pain   . No pertinent past medical history     Current Outpatient Medications on File Prior to Visit  Medication Sig Dispense Refill  . diclofenac (VOLTAREN) 75 MG EC tablet Take 1 tablet (75 mg total) 2 (two) times daily by mouth. 60 tablet 1  . nitroGLYCERIN (NITRODUR - DOSED IN MG/24 HR) 0.2 mg/hr patch Apply 1/4th patch to affected elbow, change daily 30 patch 1   No current facility-administered medications on file prior to visit.     Past Surgical History:  Procedure Laterality Date  . LAPAROSCOPIC TUBAL LIGATION  12/21/2011   Procedure: LAPAROSCOPIC TUBAL LIGATION;  Surgeon: Miguel AschoffAllan Ross, MD;  Location: [facility];  Service: Gynecology;  Laterality: Bilateral;  . NECK SURGERY      Allergies  Allergen Reactions  . Septra [Sulfamethoxazole-Trimethoprim] Hives and Other (See Comments)    Childhood reaction Hallucinations  . Sulfa Antibiotics Hives and Other (See Comments)    Hallucinations.     Social History   Socioeconomic History  . Marital status: Legally Separated    Spouse name: Not on file  . Number of children: Not on file  . Years of education: Not on file  . Highest education level: Not on file  Social Needs  . Financial resource strain: Not on file  . Food insecurity - worry: Not on file  . Food insecurity - inability: Not on file  . Transportation needs - medical: Not on file  . Transportation needs - non-medical: Not on file  Occupational History  . Not on file  Tobacco Use  . Smoking status: Current Every Day Smoker    Packs/day:  0.50  . Smokeless tobacco: Never Used  Substance and Sexual Activity  . Alcohol use: No  . Drug use: No  . Sexual activity: Not on file  Other Topics Concern  . Not on file  Social History Narrative  . Not on file    History reviewed. No pertinent family history.  BP 128/86   Pulse 87   Ht 5\' 5"  (1.651 m)   Wt 148 lb (67.1 kg)   BMI 24.63 kg/m   Review of Systems: See HPI above.     Objective:  Physical Exam:  Gen: NAD, comfortable in exam room  Left wrist/hand: No gross deformity, swelling, bruising. TTP 1st dorsal compartment.  No 1st CMC, carpal tunnel, other tenderness of wrist or hand. FROM hand and digits with pain all motions of thumb. Strength 5/5 all motions as well. + finkelsteins. Negative tinels and phalens. NVI distally. Sensation intact to light touch.  Right wrist/hand: No gross deformity, swelling, bruising. No tenderness. FROM hand and digits with 5/5 strength and no pain currently. NVI distally. Sensation intact to light touch.   Assessment & Plan:  1. Left wrist pain - 2/2 dequervains tenosynovitis.  Icing, aleve or ibuprofen.  Injection given as well.  Thumb spica brace to rest this.  F/u in 1 month.  After informed written consent, timeout performed, patient was seated in chair of exam room.  Area  overlying left 1st dorsal compartment prepped with alcohol swab then injected with 0.5:0.855mL bupivicaine: depomedrol.  Patient tolerated procedure well wihtout immediate complications.

## 2016-12-13 NOTE — Assessment & Plan Note (Signed)
2/2 dequervains tenosynovitis.  Icing, aleve or ibuprofen.  Injection given as well.  Thumb spica brace to rest this.  F/u in 1 month.  After informed written consent, timeout performed, patient was seated in chair of exam room.  Area overlying left 1st dorsal compartment prepped with alcohol swab then injected with 0.5:0.575mL bupivicaine: depomedrol.  Patient tolerated procedure well wihtout immediate complications.

## 2016-12-24 ENCOUNTER — Ambulatory Visit: Payer: Self-pay | Admitting: Family Medicine

## 2017-01-04 ENCOUNTER — Ambulatory Visit: Payer: Self-pay | Admitting: Family Medicine

## 2017-10-15 ENCOUNTER — Encounter (HOSPITAL_BASED_OUTPATIENT_CLINIC_OR_DEPARTMENT_OTHER): Payer: Self-pay

## 2017-10-15 ENCOUNTER — Other Ambulatory Visit: Payer: Self-pay

## 2017-10-15 ENCOUNTER — Emergency Department (HOSPITAL_BASED_OUTPATIENT_CLINIC_OR_DEPARTMENT_OTHER): Payer: Self-pay

## 2017-10-15 ENCOUNTER — Emergency Department (HOSPITAL_BASED_OUTPATIENT_CLINIC_OR_DEPARTMENT_OTHER)
Admission: EM | Admit: 2017-10-15 | Discharge: 2017-10-15 | Disposition: A | Discharge status: Home / Self Care | Payer: Self-pay | Attending: Emergency Medicine | Admitting: Emergency Medicine

## 2017-10-15 DIAGNOSIS — M7052 Other bursitis of knee, left knee: Secondary | ICD-10-CM | POA: Insufficient documentation

## 2017-10-15 DIAGNOSIS — Z79899 Other long term (current) drug therapy: Secondary | ICD-10-CM | POA: Insufficient documentation

## 2017-10-15 DIAGNOSIS — Y929 Unspecified place or not applicable: Secondary | ICD-10-CM | POA: Insufficient documentation

## 2017-10-15 DIAGNOSIS — Y93K1 Activity, walking an animal: Secondary | ICD-10-CM | POA: Insufficient documentation

## 2017-10-15 DIAGNOSIS — W228XXA Striking against or struck by other objects, initial encounter: Secondary | ICD-10-CM | POA: Insufficient documentation

## 2017-10-15 DIAGNOSIS — F17200 Nicotine dependence, unspecified, uncomplicated: Secondary | ICD-10-CM | POA: Insufficient documentation

## 2017-10-15 DIAGNOSIS — Y999 Unspecified external cause status: Secondary | ICD-10-CM | POA: Insufficient documentation

## 2017-10-15 MED ORDER — IBUPROFEN 600 MG PO TABS: 600.0000 mg | ORAL_TABLET | Freq: Four times a day (QID) | ORAL | 0 refills | Status: AC | PRN

## 2017-10-15 NOTE — ED Notes (Signed)
Pt verbalized understanding of discharge instructions and denies any further questions at this time.   

## 2017-10-15 NOTE — ED Triage Notes (Signed)
Pt states she injured left knee 6 days ago-pain has increased-NAD-limping gait

## 2017-10-15 NOTE — ED Notes (Signed)
Patient transported to X-ray 

## 2017-10-15 NOTE — ED Provider Notes (Signed)
[facility] EMERGENCY DEPARTMENT Provider Note   CSN: [id number] Arrival date & time: 10/15/17  1254     History   Chief Complaint Chief Complaint  Patient presents with  . Knee Injury    HPI Eileen Cisneros is a 47 y.o. female.  The history is provided by the patient. No language interpreter was used.     47 year old female presenting for evaluation of left knee pain.  Patient reports several days ago, she was trying to restrain her dog from running outside when she accidentally hit her left knee against the door casing.  She did reports initially having some mild pain to the affected area.  The next day pain was minimal and she was staying very active.  For the past 2 days she has noticed increasing pain and swelling involving her left knee.  Pain is sharp throbbing worsening with bending her knee and with walking.  No specific treatment tried at home.  No complaints of hip or ankle pain.  Her pain is mild to moderate at this time.  She is able to ambulate.  Past Medical History:  Diagnosis Date  . Back pain   . No pertinent past medical history     Patient Active Problem List   Diagnosis Date Noted  . De Quervain's tenosynovitis, left 12/13/2016  . Right lateral epicondylitis 11/13/2016    Past Surgical History:  Procedure Laterality Date  . LAPAROSCOPIC TUBAL LIGATION  12/21/2011   Procedure: LAPAROSCOPIC TUBAL LIGATION;  Surgeon: Miguel Aschoff, MD;  Location: [facility];  Service: Gynecology;  Laterality: Bilateral;  . NECK SURGERY       OB History   None      Home Medications    Prior to Admission medications   Medication Sig Start Date End Date Taking? Authorizing Provider  diclofenac (VOLTAREN) 75 MG EC tablet Take 1 tablet (75 mg total) 2 (two) times daily by mouth. 11/23/16   Hudnall, Azucena Fallen, MD  HYDROcodone-acetaminophen (NORCO) 5-325 MG tablet Take 1 tablet by mouth every 6 (six) hours as needed for moderate pain. 12/12/16   Lenda Kelp, MD    nitroGLYCERIN (NITRODUR - DOSED IN MG/24 HR) 0.2 mg/hr patch Apply 1/4th patch to affected elbow, change daily 11/12/16   Hudnall, Azucena Fallen, MD    Family History No family history on file.  Social History Social History   Tobacco Use  . Smoking status: Current Every Day Smoker    Packs/day: 0.50  . Smokeless tobacco: Never Used  Substance Use Topics  . Alcohol use: No  . Drug use: No     Allergies   Septra [sulfamethoxazole-trimethoprim] and Sulfa antibiotics   Review of Systems Review of Systems  Constitutional: Negative for fever.  Musculoskeletal: Positive for joint swelling.  Skin: Negative for wound.  Neurological: Negative for numbness.     Physical Exam Updated Vital Signs BP 112/74 (BP Location: Right Arm)   Pulse 80   Temp 98.4 F (36.9 C) (Oral)   Resp 20   Ht 5\' 5"  (1.651 m)   Wt 67.1 kg   LMP 09/26/2017 (Approximate)   SpO2 100%   BMI 24.63 kg/m   Physical Exam  Constitutional: She appears well-developed and well-nourished. No distress.  HENT:  Head: Atraumatic.  Eyes: Conjunctivae are normal.  Neck: Neck supple.  Musculoskeletal: She exhibits tenderness (Left knee: Tenderness to anterior knee superior to patella with mild swelling noted.  No erythema or warmth.  Able to flex and extend knee  with some difficulty.  No joint laxity.  Patella is located.).  Neurological: She is alert.  Skin: No rash noted.  Psychiatric: She has a normal mood and affect.  Nursing note and vitals reviewed.    ED Treatments / Results  Labs (all labs ordered are listed, but only abnormal results are displayed) Labs Reviewed - No data to display  EKG None  Radiology Dg Knee Complete 4 Views Left  Result Date: 10/15/2017 CLINICAL DATA:  Direct trauma to the left knee 4 days ago. Pain is localized to the patella. EXAM: LEFT KNEE - COMPLETE 4+ VIEW COMPARISON:  None. FINDINGS: The bones are subjectively adequately mineralized. The joint spaces are well  maintained. There is a small suprapatellar effusion. There is no acute fracture nor dislocation. IMPRESSION: There is no acute bony abnormality of the left knee. There is a suprapatellar effusion. Electronically Signed   By: David  [place] M.D.   On: 10/15/2017 13:31    Procedures Procedures (including critical care time)  Medications Ordered in ED Medications - No data to display   Initial Impression / Assessment and Plan / ED Course  I have reviewed the triage vital signs and the nursing notes.  Pertinent labs & imaging results that were available during my care of the patient were reviewed by me and considered in my medical decision making (see chart for details).     BP 112/74 (BP Location: Right Arm)   Pulse 80   Temp 98.4 F (36.9 C) (Oral)   Resp 20   Ht 5\' 5"  (1.651 m)   Wt 67.1 kg   LMP 09/26/2017 (Approximate)   SpO2 100%   BMI 24.63 kg/m    Final Clinical Impressions(s) / ED Diagnoses   Final diagnoses:  Suprapatellar bursitis of left knee    ED Discharge Orders         Ordered    ibuprofen (ADVIL,MOTRIN) 600 MG tablet  Every 6 hours PRN     10/15/17 1358         1:56 PM Patient injured her left knee a few days ago and now having trouble with pain and ambulation.  Patient does have some tenderness to the anterior knee without any inciting feature to suggest infection or any obvious dislocation.  X-ray demonstrate no acute bony abnormalities.  There is suprapatellar effusion.  Will place knee in a knee sleeves, rice therapy discussed.  Orthopedic referral given as needed.   Fayrene Helper, PA-C 10/15/17 1359    Pricilla Loveless, MD 10/15/17 [phone]

## 2018-08-07 ENCOUNTER — Other Ambulatory Visit: Payer: Self-pay | Admitting: Obstetrics & Gynecology

## 2018-08-07 DIAGNOSIS — N644 Mastodynia: Secondary | ICD-10-CM

## 2018-08-08 ENCOUNTER — Ambulatory Visit
Admission: RE | Admit: 2018-08-08 | Discharge: 2018-08-08 | Disposition: A | Discharge status: Home / Self Care | Payer: BC Managed Care – PPO | Source: Ambulatory Visit | Attending: Obstetrics & Gynecology | Admitting: Obstetrics & Gynecology

## 2018-08-08 ENCOUNTER — Ambulatory Visit: Payer: Self-pay

## 2018-08-08 ENCOUNTER — Ambulatory Visit: Admission: RE | Admit: 2018-08-08 | Payer: Self-pay | Source: Ambulatory Visit

## 2018-08-08 ENCOUNTER — Other Ambulatory Visit: Payer: Self-pay

## 2018-08-08 DIAGNOSIS — N644 Mastodynia: Secondary | ICD-10-CM

## 2021-06-01 ENCOUNTER — Other Ambulatory Visit: Payer: Self-pay | Admitting: Obstetrics & Gynecology

## 2021-06-01 DIAGNOSIS — N644 Mastodynia: Secondary | ICD-10-CM

## 2021-07-25 ENCOUNTER — Ambulatory Visit
Admission: RE | Admit: 2021-07-25 | Discharge: 2021-07-25 | Disposition: A | Discharge status: Home / Self Care | Payer: No Typology Code available for payment source | Source: Ambulatory Visit | Attending: Obstetrics & Gynecology | Admitting: Obstetrics & Gynecology

## 2021-07-25 ENCOUNTER — Ambulatory Visit
Admission: RE | Admit: 2021-07-25 | Discharge: 2021-07-25 | Disposition: A | Discharge status: Home / Self Care | Payer: Self-pay | Source: Ambulatory Visit | Attending: Obstetrics & Gynecology | Admitting: Obstetrics & Gynecology

## 2021-07-25 DIAGNOSIS — N644 Mastodynia: Secondary | ICD-10-CM
